# Patient Record
Sex: Female | Born: 1940
Health system: Southern US, Community
[De-identification: ages and names within clinical notes are randomized; demographics above are authoritative.]

## PROBLEM LIST (undated history)

## (undated) ENCOUNTER — Emergency Department (HOSPITAL_COMMUNITY): Payer: Medicare PPO | Source: Home / Self Care

## (undated) DIAGNOSIS — F319 Bipolar disorder, unspecified: Secondary | ICD-10-CM

## (undated) DIAGNOSIS — K219 Gastro-esophageal reflux disease without esophagitis: Secondary | ICD-10-CM

## (undated) DIAGNOSIS — M1990 Unspecified osteoarthritis, unspecified site: Secondary | ICD-10-CM

## (undated) DIAGNOSIS — E039 Hypothyroidism, unspecified: Secondary | ICD-10-CM

## (undated) DIAGNOSIS — F32A Depression, unspecified: Secondary | ICD-10-CM

## (undated) DIAGNOSIS — E079 Disorder of thyroid, unspecified: Secondary | ICD-10-CM

## (undated) DIAGNOSIS — E7800 Pure hypercholesterolemia, unspecified: Secondary | ICD-10-CM

## (undated) DIAGNOSIS — J449 Chronic obstructive pulmonary disease, unspecified: Secondary | ICD-10-CM

## (undated) DIAGNOSIS — N390 Urinary tract infection, site not specified: Secondary | ICD-10-CM

## (undated) HISTORY — PX: CHOLECYSTECTOMY: SHX55

## (undated) HISTORY — DX: Gastro-esophageal reflux disease without esophagitis: K21.9

## (undated) HISTORY — PX: EXTERNAL FIXATION WRIST FRACTURE: SHX1553

## (undated) HISTORY — DX: Depression, unspecified: F32.A

---

## 2012-11-27 ENCOUNTER — Encounter (HOSPITAL_COMMUNITY): Payer: Self-pay | Admitting: *Deleted

## 2012-11-27 ENCOUNTER — Emergency Department (HOSPITAL_COMMUNITY)
Admission: EM | Admit: 2012-11-27 | Discharge: 2012-11-30 | Disposition: A | Discharge status: Other Acute Inpatient Hospital | Payer: Medicare PPO | Attending: Emergency Medicine | Admitting: Emergency Medicine

## 2012-11-27 DIAGNOSIS — Z8639 Personal history of other endocrine, nutritional and metabolic disease: Secondary | ICD-10-CM | POA: Insufficient documentation

## 2012-11-27 DIAGNOSIS — N390 Urinary tract infection, site not specified: Secondary | ICD-10-CM | POA: Insufficient documentation

## 2012-11-27 DIAGNOSIS — R443 Hallucinations, unspecified: Secondary | ICD-10-CM | POA: Insufficient documentation

## 2012-11-27 DIAGNOSIS — F22 Delusional disorders: Secondary | ICD-10-CM | POA: Insufficient documentation

## 2012-11-27 DIAGNOSIS — A499 Bacterial infection, unspecified: Secondary | ICD-10-CM | POA: Insufficient documentation

## 2012-11-27 DIAGNOSIS — F17200 Nicotine dependence, unspecified, uncomplicated: Secondary | ICD-10-CM | POA: Insufficient documentation

## 2012-11-27 DIAGNOSIS — Z862 Personal history of diseases of the blood and blood-forming organs and certain disorders involving the immune mechanism: Secondary | ICD-10-CM | POA: Insufficient documentation

## 2012-11-27 DIAGNOSIS — B9689 Other specified bacterial agents as the cause of diseases classified elsewhere: Secondary | ICD-10-CM | POA: Insufficient documentation

## 2012-11-27 DIAGNOSIS — G4700 Insomnia, unspecified: Secondary | ICD-10-CM | POA: Insufficient documentation

## 2012-11-27 DIAGNOSIS — J4489 Other specified chronic obstructive pulmonary disease: Secondary | ICD-10-CM | POA: Insufficient documentation

## 2012-11-27 DIAGNOSIS — R4182 Altered mental status, unspecified: Secondary | ICD-10-CM | POA: Insufficient documentation

## 2012-11-27 DIAGNOSIS — F319 Bipolar disorder, unspecified: Secondary | ICD-10-CM | POA: Insufficient documentation

## 2012-11-27 DIAGNOSIS — J449 Chronic obstructive pulmonary disease, unspecified: Secondary | ICD-10-CM | POA: Insufficient documentation

## 2012-11-27 HISTORY — DX: Chronic obstructive pulmonary disease, unspecified: J44.9

## 2012-11-27 HISTORY — DX: Disorder of thyroid, unspecified: E07.9

## 2012-11-27 HISTORY — DX: Bipolar disorder, unspecified: F31.9

## 2012-11-27 HISTORY — DX: Pure hypercholesterolemia, unspecified: E78.00

## 2012-11-27 LAB — CBC WITH DIFFERENTIAL/PLATELET
Basophils Absolute: 0 10*3/uL (ref 0.0–0.1)
Basophils Relative: 0 % (ref 0–1)
Eosinophils Relative: 2 % (ref 0–5)
HCT: 41.3 % (ref 36.0–46.0)
Hemoglobin: 13.7 g/dL (ref 12.0–15.0)
MCH: 33.6 pg (ref 26.0–34.0)
MCHC: 33.2 g/dL (ref 30.0–36.0)
MCV: 101.2 fL — ABNORMAL HIGH (ref 78.0–100.0)
Monocytes Absolute: 0.4 10*3/uL (ref 0.1–1.0)
Monocytes Relative: 4 % (ref 3–12)
RDW: 12.8 % (ref 11.5–15.5)

## 2012-11-27 LAB — COMPREHENSIVE METABOLIC PANEL
AST: 15 U/L (ref 0–37)
Albumin: 3.7 g/dL (ref 3.5–5.2)
BUN: 11 mg/dL (ref 6–23)
Calcium: 10 mg/dL (ref 8.4–10.5)
Creatinine, Ser: 1.07 mg/dL (ref 0.50–1.10)
GFR calc non Af Amer: 50 mL/min — ABNORMAL LOW (ref >90)
Total Bilirubin: 0.3 mg/dL (ref 0.3–1.2)

## 2012-11-27 LAB — RAPID URINE DRUG SCREEN, HOSP PERFORMED
Barbiturates: NOT DETECTED
Benzodiazepines: NOT DETECTED
Cocaine: NOT DETECTED
Opiates: NOT DETECTED

## 2012-11-27 LAB — URINALYSIS, ROUTINE W REFLEX MICROSCOPIC
Glucose, UA: NEGATIVE mg/dL
Protein, ur: NEGATIVE mg/dL
pH: 7 (ref 5.0–8.0)

## 2012-11-27 LAB — URINE MICROSCOPIC-ADD ON

## 2012-11-27 MED ORDER — BECLOMETHASONE DIPROPIONATE 80 MCG/ACT IN AERS
2.0000 | INHALATION_SPRAY | Freq: Two times a day (BID) | RESPIRATORY_TRACT | Status: DC
  Filled 2012-11-27: qty 8.7

## 2012-11-27 MED ORDER — ATORVASTATIN CALCIUM 10 MG PO TABS
10.0000 mg | ORAL_TABLET | Freq: Every day | ORAL | Status: DC
  Administered 2012-11-27 – 2012-11-29 (×3): 10 mg via ORAL
  Filled 2012-11-27 (×5): qty 1

## 2012-11-27 MED ORDER — ALUM & MAG HYDROXIDE-SIMETH 200-200-20 MG/5ML PO SUSP: 30.0000 mL | ORAL | Status: DC | PRN

## 2012-11-27 MED ORDER — SODIUM CHLORIDE 0.9 % IV BOLUS (SEPSIS)
1000.0000 mL | Freq: Once | INTRAVENOUS | Status: AC
  Administered 2012-11-27: 1000 mL via INTRAVENOUS

## 2012-11-27 MED ORDER — ACETAMINOPHEN 325 MG PO TABS: 650.0000 mg | ORAL_TABLET | ORAL | Status: DC | PRN

## 2012-11-27 MED ORDER — NICOTINE 21 MG/24HR TD PT24
21.0000 mg | MEDICATED_PATCH | Freq: Every day | TRANSDERMAL | Status: DC
  Administered 2012-11-27 – 2012-11-29 (×2): 21 mg via TRANSDERMAL
  Filled 2012-11-27 (×2): qty 1

## 2012-11-27 MED ORDER — ALPRAZOLAM 0.5 MG PO TABS
1.0000 mg | ORAL_TABLET | Freq: Three times a day (TID) | ORAL | Status: DC | PRN
  Administered 2012-11-27: 1 mg via ORAL
  Filled 2012-11-27: qty 2

## 2012-11-27 MED ORDER — ONDANSETRON HCL 4 MG PO TABS: 4.0000 mg | ORAL_TABLET | Freq: Three times a day (TID) | ORAL | Status: DC | PRN

## 2012-11-27 MED ORDER — ZOLPIDEM TARTRATE 5 MG PO TABS: 5.0000 mg | ORAL_TABLET | Freq: Every evening | ORAL | Status: DC | PRN

## 2012-11-27 MED ORDER — LEVOTHYROXINE SODIUM 100 MCG PO TABS
100.0000 ug | ORAL_TABLET | Freq: Every day | ORAL | Status: DC
  Administered 2012-11-28 – 2012-11-29 (×2): 100 ug via ORAL
  Filled 2012-11-27 (×5): qty 1

## 2012-11-27 MED ORDER — LORATADINE 10 MG PO TABS
10.0000 mg | ORAL_TABLET | Freq: Every day | ORAL | Status: DC
  Administered 2012-11-27 – 2012-11-29 (×3): 10 mg via ORAL
  Filled 2012-11-27 (×3): qty 1

## 2012-11-27 NOTE — ED Notes (Signed)
Pt reports " make sure I take my lithium , I'm more afraid if I don't take it'. Pt does not understand why she is here " my daughter thinks I need to be seen". But is unsure why. Pt states" I live by myself, I'm not crazy, but I'll play along cuz I love my daughter".

## 2012-11-27 NOTE — ED Notes (Signed)
Called  [facility] for a consult.  Spoke with Jorja Loa, who took Pt info.  Says," he will call back with an appt time".  Nurse informed.
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
## 2012-11-27 NOTE — ED Notes (Signed)
BH called with 8pm appt. Time. Tele Cart placed in the room.

## 2012-11-27 NOTE — ED Notes (Signed)
Pt notified of request to stay overnight and get Lithium level down, rehydrate and re-evaluate in the morning.

## 2012-11-27 NOTE — ED Notes (Signed)
CRITICAL VALUE ALERT  Critical value received:  Lithium 1.80  Date of notification:  11/27/12  Time of notification:  1833  Critical value read back:yes  Nurse who received alert:  Tarri Glenn RN   MD notified (1st page):  Dr Rosalia Hammers  Time of first page:  1833  MD notified (2nd page):  Time of second page:  Responding MD:  Dr Rosalia Hammers  Time MD responded:  [id number]
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
## 2012-11-27 NOTE — ED Notes (Signed)
Patient ambulated to restroom with assist, tolerated well 

## 2012-11-27 NOTE — ED Notes (Signed)
Tele-psych conference occurring at this time. Pt answering questions, daughter and pt sitter at bedside.
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
## 2012-11-28 ENCOUNTER — Emergency Department (HOSPITAL_COMMUNITY): Payer: Medicare PPO

## 2012-11-28 MED ORDER — FLUTICASONE PROPIONATE HFA 44 MCG/ACT IN AERO
2.0000 | INHALATION_SPRAY | Freq: Two times a day (BID) | RESPIRATORY_TRACT | Status: DC
  Administered 2012-11-28 – 2012-11-29 (×4): 2 via RESPIRATORY_TRACT
  Filled 2012-11-28: qty 10.6

## 2012-11-28 MED ORDER — CEPHALEXIN 500 MG PO CAPS
500.0000 mg | ORAL_CAPSULE | Freq: Three times a day (TID) | ORAL | Status: DC
  Administered 2012-11-28 – 2012-11-30 (×5): 500 mg via ORAL
  Filled 2012-11-28 (×4): qty 1

## 2012-11-28 NOTE — ED Notes (Signed)
Pt gave verbal permission to speak with daughter Deloris Cresenciano Genre

## 2012-11-28 NOTE — ED Provider Notes (Signed)
  Lithium level has returned, UA is equivocal for UTI  Waiting to have telepsych by psychiatrist.    URINALYSIS, ROUTINE W REFLEX MICROSCOPIC      Result Value Range   Color, Urine YELLOW  YELLOW   APPearance CLEAR  CLEAR   Specific Gravity, Urine <1.005 (*) 1.005 - 1.030   pH 7.0  5.0 - 8.0   Glucose, UA NEGATIVE  NEGATIVE mg/dL   Hgb urine dipstick TRACE (*) NEGATIVE   Bilirubin Urine NEGATIVE  NEGATIVE   Ketones, ur NEGATIVE  NEGATIVE mg/dL   Protein, ur NEGATIVE  NEGATIVE mg/dL   Urobilinogen, UA 0.2  0.0 - 1.0 mg/dL   Nitrite NEGATIVE  NEGATIVE   Leukocytes, UA TRACE (*) NEGATIVE  URINE MICROSCOPIC-ADD ON      Result Value Range   WBC, UA 0-2  <3 WBC/hpf   Bacteria, UA RARE  RARE  LITHIUM LEVEL      Result Value Range   Lithium Lvl 1.37  0.80 - 1.40 mEq/L      Ward Givens, MD 11/28/12 1112

## 2012-11-28 NOTE — BH Assessment (Signed)
[facility] Assessment Progress Note  Update:  Received call from Dr. Lucianne Muss stating she completed the pt's tele assessment and inpatient stabilization recommended.  Called [facility] and no beds per [facility] @ 1419.  No beds at [facility].  Called [facility], and per [place] @ 1422, beds available.  Referral faxed for review.  Called [facility] and per [facility] @ [phone], they may have beds this afternoon and told writer to fax referral.  Referral faxed for review.  Called [facility] and no beds per [facility] @ 1441.  Clinician to continue to follow up on pt referral.

## 2012-11-28 NOTE — ED Notes (Signed)
CONTACT NUMBER FOR PT  [facility] [phone]

## 2012-11-28 NOTE — ED Notes (Signed)
Sitter remains at bedside,  

## 2012-11-28 NOTE — ED Provider Notes (Signed)
Dg Chest 2 View  11/28/2012   *RADIOLOGY REPORT*  Clinical Data: AMS, UTI, WEAKNESS, TREMOR  CHEST - 2 VIEW  Comparison: Report from 02/12/2008  Findings: The patient is rotated to the right on today's exam, resulting in reduced diagnostic sensitivity and specificity. Upper normal heart size.  Atherosclerotic calcification of the aortic arch noted.  The lungs appear clear.  Large lung volumes raise possibility of emphysema.  IMPRESSION:  1.  Large lung volumes, query emphysema. 2.  No acute findings. 3.  Aortic arch atherosclerosis.   Original Report Authenticated By: Gaylyn Rong, M.D.     Ward Givens, MD 11/28/12 [phone]

## 2012-11-28 NOTE — Progress Notes (Signed)
Gave pt inhaler flovent, pt did very poorly. She has some wheezes also may need neb treatment later.

## 2012-11-28 NOTE — ED Notes (Signed)
Dr Knapp at bedside,  

## 2012-11-28 NOTE — ED Notes (Signed)
Spoke with [facility] who advised that they are still seeking placement for pt, pt and family updated on plan of care and delay,
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
## 2012-11-28 NOTE — ED Notes (Signed)
PATIENT'S SON CALLED TO CHECK ON PATIENT, STATES THAT HE WOULD CALL BACK IN THE MORNING TO SEE IF ANY OTHER ARRANGEMENTS HAD BEEN MADE.

## 2012-11-28 NOTE — Progress Notes (Signed)
Teleassessment from yesterday reviewed with Dr. Lucianne Muss, MD. Dr. Lucianne Muss recommends geropsych inpatient hospitalization if symptoms continue after medically cleared. Rosey Bath, RN

## 2012-11-28 NOTE — ED Notes (Signed)
Psych consult completed,

## 2012-11-28 NOTE — ED Provider Notes (Signed)
Patient has history of bipolar disorder and was brought in yesterday by her daughter who felt patient was having visual hallucinations. Patient reports over the past week she's had worsening of a mild tremor that she's had in the past. She was noted to have a toxic lithium level of 1.8. Patient states her daughter doesn't understand her and she does not understand her daughter. She states she's feeling good this morning. She is noted to still have some tremor, her lithium level will be repeated this morning.  Patient was seen by [facility] yesterday and she has pending psychiatric evaluation once her lithium level is no longer toxic.  Devoria Albe, MD, [facility]   Ward Givens, MD 11/28/12 [phone]

## 2012-11-28 NOTE — ED Notes (Signed)
Daughter at bedside, update given to pt and daughter on plan of care, pt became upset with daughter after being told that she would need to be admitted to a psychiatric facility, states "I don't ever want to see you again, I hope I go somewhere and never come back" comfort measures provided to pt and daughter.
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
## 2012-11-29 MED ORDER — CEPHALEXIN 500 MG PO CAPS
ORAL_CAPSULE | ORAL | Status: AC
  Administered 2012-11-29: 500 mg via ORAL
  Filled 2012-11-29: qty 1

## 2012-11-29 NOTE — ED Notes (Signed)
Patient washed up and changed blue paper scrubs. Patient is anxious a this time. Sitting in chair looking out doorway.

## 2012-11-29 NOTE — ED Notes (Signed)
[facility] called states they are working on placement.

## 2012-11-29 NOTE — BH Assessment (Addendum)
[facility]HH Assessment Progress Note  This Clinical research associate received a call from [place] at [facility] at 18:49.  He reports that Dr Betti Cruz has agreed to accept pt to their wait list.  It is anticipated that a bed will be available tomorrow (12/01/2102).  He reports that one of their clinicians should call between 04:00 and 07:00 tomorrow, but that [name] or [facility] staff should call [phone] if we do not hear from [facility] in that time frame.  Christiane Ha inquired as to whether pt is currently under IVC.  I agreed to call him back with this information.  Doylene Canning, MA Triage Specialist 11/29/2012 @ 18:56  At 19:04 I received a call from the pt's nurse, Amber.  She reports that pt is under IVC.  At 19:06 I called back to [place].  I agreed to have [name] fax commitment papers to him at [phone].  At 19:09 I called back to [facility] and spoke to the pt's oncoming nurse, Marchelle Folks, providing the fax number.  She agreed to fax the commitment papers to [facility].  Doylene Canning, MA Triage Specialist 11/29/2012 @ 19:10

## 2012-11-29 NOTE — ED Notes (Signed)
Spoke with Kristen Sanchez from [facility] states that pt is placed on waiting list at [facility] and needs [place] to fax commitment papers to them

## 2012-11-29 NOTE — ED Notes (Signed)
Breakfast tray given. °

## 2012-11-29 NOTE — BH Assessment (Signed)
Courtney from [facility] called and said Pt has been declined due to not meeting criteria for their program.  Harlin Rain Ria Comment, [facility] Triage Specialist

## 2012-11-29 NOTE — ED Notes (Signed)
Pt sitting in chair. nad noted. Appropriate at this time. Denies any si or hi.

## 2012-11-29 NOTE — ED Notes (Addendum)
Commitment papers faxed to [facility] per [facility] request with [facility] [phone] to [employer].

## 2012-11-29 NOTE — ED Notes (Signed)
Pt's eyes closed. resp even/nonlabored. Even/rise fall of chest. Sitter at bsd. nad noted

## 2012-11-30 LAB — LITHIUM LEVEL: Lithium Lvl: 0.66 mEq/L — ABNORMAL LOW (ref 0.80–1.40)

## 2012-11-30 MED ORDER — CEPHALEXIN 500 MG PO CAPS
ORAL_CAPSULE | ORAL | Status: AC
  Filled 2012-11-30: qty 1

## 2012-11-30 NOTE — ED Notes (Signed)
[facility] called and requested IVC paperwork be faxed to them for review and then will try to get pt placed at [facility].

## 2012-11-30 NOTE — ED Notes (Signed)
[facility] at Bedside to transport pt.

## 2012-11-30 NOTE — ED Provider Notes (Signed)
[facility] is requesting repeat lithium level and this has been ordered.  Dione Booze, MD 11/30/12 [phone]
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
## 2012-11-30 NOTE — ED Notes (Signed)
Patient sitting in chair in room.  Sitter remains with patient.

## 2012-11-30 NOTE — BHH Counselor (Signed)
Per Juliann Pulse), pt accepted by Dr. Betti Cruz.  Pt has been accepted to [facility]. Report will need to be called to [phone].  Pt can be transported after 8am.

## 2012-11-30 NOTE — ED Notes (Signed)
Patient ambulated to bathroom; gait steady.  Patient returned to room and sitting up on side of bed.  Sitter remains with patient.

## 2013-01-22 ENCOUNTER — Encounter (HOSPITAL_COMMUNITY): Payer: Self-pay | Admitting: *Deleted

## 2013-01-22 ENCOUNTER — Emergency Department (HOSPITAL_COMMUNITY)
Admission: EM | Admit: 2013-01-22 | Discharge: 2013-01-24 | Disposition: A | Discharge status: Other Acute Inpatient Hospital | Payer: Medicare PPO | Attending: Emergency Medicine | Admitting: Emergency Medicine

## 2013-01-22 DIAGNOSIS — J4489 Other specified chronic obstructive pulmonary disease: Secondary | ICD-10-CM | POA: Insufficient documentation

## 2013-01-22 DIAGNOSIS — E079 Disorder of thyroid, unspecified: Secondary | ICD-10-CM | POA: Insufficient documentation

## 2013-01-22 DIAGNOSIS — F17200 Nicotine dependence, unspecified, uncomplicated: Secondary | ICD-10-CM | POA: Insufficient documentation

## 2013-01-22 DIAGNOSIS — IMO0002 Reserved for concepts with insufficient information to code with codable children: Secondary | ICD-10-CM | POA: Insufficient documentation

## 2013-01-22 DIAGNOSIS — F319 Bipolar disorder, unspecified: Secondary | ICD-10-CM | POA: Insufficient documentation

## 2013-01-22 DIAGNOSIS — J449 Chronic obstructive pulmonary disease, unspecified: Secondary | ICD-10-CM | POA: Insufficient documentation

## 2013-01-22 DIAGNOSIS — R441 Visual hallucinations: Secondary | ICD-10-CM

## 2013-01-22 DIAGNOSIS — Z87440 Personal history of urinary (tract) infections: Secondary | ICD-10-CM | POA: Insufficient documentation

## 2013-01-22 DIAGNOSIS — Z79899 Other long term (current) drug therapy: Secondary | ICD-10-CM | POA: Insufficient documentation

## 2013-01-22 DIAGNOSIS — Z792 Long term (current) use of antibiotics: Secondary | ICD-10-CM | POA: Insufficient documentation

## 2013-01-22 DIAGNOSIS — H5316 Psychophysical visual disturbances: Secondary | ICD-10-CM | POA: Insufficient documentation

## 2013-01-22 DIAGNOSIS — E876 Hypokalemia: Secondary | ICD-10-CM | POA: Insufficient documentation

## 2013-01-22 DIAGNOSIS — E7800 Pure hypercholesterolemia, unspecified: Secondary | ICD-10-CM | POA: Insufficient documentation

## 2013-01-22 DIAGNOSIS — R4182 Altered mental status, unspecified: Secondary | ICD-10-CM | POA: Insufficient documentation

## 2013-01-22 HISTORY — DX: Urinary tract infection, site not specified: N39.0

## 2013-01-22 LAB — CBC WITH DIFFERENTIAL/PLATELET
Basophils Absolute: 0 10*3/uL (ref 0.0–0.1)
HCT: 34.3 % — ABNORMAL LOW (ref 36.0–46.0)
Lymphocytes Relative: 20 % (ref 12–46)
Lymphs Abs: 1.4 10*3/uL (ref 0.7–4.0)
MCV: 98.3 fL (ref 78.0–100.0)
Monocytes Absolute: 0.5 10*3/uL (ref 0.1–1.0)
Neutro Abs: 5.1 10*3/uL (ref 1.7–7.7)
Platelets: 295 10*3/uL (ref 150–400)
RBC: 3.49 MIL/uL — ABNORMAL LOW (ref 3.87–5.11)
RDW: 13.4 % (ref 11.5–15.5)
WBC: 7.1 10*3/uL (ref 4.0–10.5)

## 2013-01-22 LAB — URINE MICROSCOPIC-ADD ON

## 2013-01-22 LAB — BASIC METABOLIC PANEL
CO2: 30 mEq/L (ref 19–32)
Chloride: 100 mEq/L (ref 96–112)
Glucose, Bld: 101 mg/dL — ABNORMAL HIGH (ref 70–99)
Sodium: 139 mEq/L (ref 135–145)

## 2013-01-22 LAB — URINALYSIS, ROUTINE W REFLEX MICROSCOPIC
Leukocytes, UA: NEGATIVE
Nitrite: NEGATIVE
Specific Gravity, Urine: <1.005 — ABNORMAL LOW (ref 1.005–1.030)
Urobilinogen, UA: 0.2 mg/dL (ref 0.0–1.0)
pH: 6 (ref 5.0–8.0)

## 2013-01-22 LAB — LITHIUM LEVEL: Lithium Lvl: <0.25 mEq/L — ABNORMAL LOW (ref 0.80–1.40)

## 2013-01-22 MED ORDER — LAMOTRIGINE 25 MG PO TABS: 25.0000 mg | ORAL_TABLET | Freq: Every day | ORAL | Status: DC

## 2013-01-22 MED ORDER — LAMOTRIGINE 25 MG PO TABS
25.0000 mg | ORAL_TABLET | Freq: Every day | ORAL | Status: DC
  Administered 2013-01-23 – 2013-01-24 (×2): 25 mg via ORAL
  Filled 2013-01-22 (×4): qty 1

## 2013-01-22 MED ORDER — LURASIDONE HCL 40 MG PO TABS
20.0000 mg | ORAL_TABLET | Freq: Every day | ORAL | Status: DC
  Administered 2013-01-23 – 2013-01-24 (×2): 20 mg via ORAL
  Filled 2013-01-22 (×4): qty 1

## 2013-01-22 MED ORDER — ATORVASTATIN CALCIUM 10 MG PO TABS
10.0000 mg | ORAL_TABLET | Freq: Every day | ORAL | Status: DC
  Administered 2013-01-23: 10 mg via ORAL
  Filled 2013-01-22 (×4): qty 1

## 2013-01-22 MED ORDER — LEVOTHYROXINE SODIUM 100 MCG PO TABS
100.0000 ug | ORAL_TABLET | Freq: Every day | ORAL | Status: DC
  Administered 2013-01-23 – 2013-01-24 (×2): 100 ug via ORAL
  Filled 2013-01-22 (×5): qty 1

## 2013-01-22 MED ORDER — ALPRAZOLAM 0.5 MG PO TABS
0.5000 mg | ORAL_TABLET | Freq: Every evening | ORAL | Status: DC | PRN
  Administered 2013-01-23 (×2): 0.5 mg via ORAL
  Filled 2013-01-22 (×2): qty 1

## 2013-01-22 MED ORDER — POTASSIUM CHLORIDE CRYS ER 20 MEQ PO TBCR
40.0000 meq | EXTENDED_RELEASE_TABLET | Freq: Three times a day (TID) | ORAL | Status: AC
  Administered 2013-01-22 – 2013-01-23 (×3): 40 meq via ORAL
  Filled 2013-01-22 (×3): qty 2

## 2013-01-22 MED ORDER — POTASSIUM CHLORIDE CRYS ER 20 MEQ PO TBCR
40.0000 meq | EXTENDED_RELEASE_TABLET | Freq: Once | ORAL | Status: AC
  Administered 2013-01-22: 40 meq via ORAL
  Filled 2013-01-22: qty 2

## 2013-01-22 MED ORDER — FLUTICASONE PROPIONATE HFA 44 MCG/ACT IN AERO
2.0000 | INHALATION_SPRAY | Freq: Two times a day (BID) | RESPIRATORY_TRACT | Status: DC
  Administered 2013-01-22 – 2013-01-24 (×4): 2 via RESPIRATORY_TRACT
  Filled 2013-01-22 (×2): qty 10.6

## 2013-01-22 MED ORDER — FLUTICASONE PROPIONATE HFA 44 MCG/ACT IN AERO
INHALATION_SPRAY | RESPIRATORY_TRACT | Status: AC
  Filled 2013-01-22: qty 10.6

## 2013-01-22 MED ORDER — FUROSEMIDE 40 MG PO TABS
20.0000 mg | ORAL_TABLET | ORAL | Status: DC
  Administered 2013-01-24: 20 mg via ORAL
  Filled 2013-01-22: qty 1

## 2013-01-22 NOTE — ED Notes (Signed)
Pt was served with IVC papers per [employer].
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
## 2013-01-22 NOTE — ED Provider Notes (Signed)
Awaiting geriatric psych placement  Donnetta Hutching, MD 01/22/13 2237

## 2013-01-22 NOTE — ED Notes (Signed)
Pt was served with IVC papers by [employer] at this time . IVC paperwork is good for 7 days from time served. Kristen Sanchez

## 2013-01-22 NOTE — ED Notes (Signed)
Tele-psych with Ala Dach at [facility] completed.  Counselor spoke with Dr Adriana Simas Per hisAla Dach) request

## 2013-01-22 NOTE — ED Notes (Addendum)
Has had progressive confusion since 11/27/12.  Was admitted to [facility], then went to [place], back to [facility].  2 weeks ago she began getting confused again, having visual and auditory hallucinations according to granddaughter.  Continuous talking, flight of ideas. Granddaughter states patient was seen at [facility] yesterday for fall at home.  Patient denies this.  Bilateral ankle edema.

## 2013-01-22 NOTE — ED Provider Notes (Addendum)
CSN: [id number]     Arrival date & time 01/22/13  1852 History   First MD Initiated Contact with Patient 01/22/13 1909     Chief Complaint  Patient presents with  . Altered Mental Status  . Hallucinations   (Consider location/radiation/quality/duration/timing/severity/associated sxs/prior Treatment) Patient is a 72 y.o. female presenting with altered mental status. The history is provided by a relative (the grandaughter states the pt is having hallucinations.  the pt is speaking to people who are not there).  Altered Mental Status Presenting symptoms: behavior changes   Severity:  Severe Most recent episode:  More than 2 days ago Episode history:  Multiple Timing:  Constant Progression:  Worsening Chronicity:  Recurrent Context: not alcohol use   Associated symptoms: hallucinations   Associated symptoms: no abdominal pain, no headaches, no rash and no seizures     Past Medical History  Diagnosis Date  . Bipolar disorder   . Thyroid disease   . High cholesterol   . COPD (chronic obstructive pulmonary disease)   . UTI (lower urinary tract infection)    History reviewed. No pertinent past surgical history. History reviewed. No pertinent family history. History  Substance Use Topics  . Smoking status: Current Every Day Smoker -- 1.00 packs/day    Types: Cigarettes  . Smokeless tobacco: Not on file  . Alcohol Use: No   OB History   Grav Para Term Preterm Abortions TAB SAB Ect Mult Living                 Review of Systems  Constitutional: Negative for appetite change and fatigue.  HENT: Negative for congestion, sinus pressure and ear discharge.   Eyes: Negative for discharge.  Respiratory: Negative for cough.   Cardiovascular: Negative for chest pain.  Gastrointestinal: Negative for abdominal pain and diarrhea.  Genitourinary: Negative for frequency and hematuria.  Musculoskeletal: Negative for back pain.  Skin: Negative for rash.  Neurological: Negative for seizures  and headaches.  Psychiatric/Behavioral: Positive for hallucinations.    Allergies  Review of patient's allergies indicates no known allergies.  Home Medications   Current Outpatient Rx  Name  Route  Sig  Dispense  Refill  . ALPRAZolam (XANAX) 0.5 MG tablet   Oral   Take 0.5 mg by mouth at bedtime as needed for sleep or anxiety.         . beclomethasone (QVAR) 80 MCG/ACT inhaler   Inhalation   Inhale 1 puff into the lungs 2 (two) times daily.         Marland Kitchen lamoTRIgine (LAMICTAL) 25 MG tablet   Oral   Take 25 mg by mouth daily. 3 day course starting on 01/17/2013         . levofloxacin (LEVAQUIN) 750 MG tablet   Oral   Take 750 mg by mouth daily.          . Lurasidone HCl (LATUDA) 20 MG TABS   Oral   Take 20 mg by mouth daily.         Marland Kitchen atorvastatin (LIPITOR) 10 MG tablet   Oral   Take 10 mg by mouth at bedtime.         . furosemide (LASIX) 20 MG tablet   Oral   Take 20 mg by mouth 2 (two) times a week.         . levothyroxine (SYNTHROID, LEVOTHROID) 100 MCG tablet   Oral   Take 100 mcg by mouth every morning.         Marland Kitchen  loratadine (CLARITIN) 10 MG tablet   Oral   Take 10 mg by mouth every morning.          BP 107/52  Pulse 88  Temp(Src) 97.3 F (36.3 C) (Oral)  Resp 18  SpO2 95% Physical Exam  Constitutional: She is oriented to person, place, and time. She appears well-developed.  HENT:  Head: Normocephalic.  Eyes: Conjunctivae and EOM are normal. No scleral icterus.  Neck: Neck supple. No thyromegaly present.  Cardiovascular: Normal rate and regular rhythm.  Exam reveals no gallop and no friction rub.   No murmur heard. Pulmonary/Chest: No stridor. She has no wheezes. She has no rales. She exhibits no tenderness.  Abdominal: She exhibits no distension. There is no tenderness. There is no rebound.  Musculoskeletal: Normal range of motion. She exhibits no edema.  Lymphadenopathy:    She has no cervical adenopathy.  Neurological: She is  oriented to person, place, and time. She exhibits normal muscle tone. Coordination normal.  Skin: No rash noted. No erythema.  Psychiatric:  The pt is having visual hallucinations    ED Course  Procedures (including critical care time) Labs Review Labs Reviewed  CBC WITH DIFFERENTIAL - Abnormal; Notable for the following:    RBC 3.49 (*)    Hemoglobin 11.4 (*)    HCT 34.3 (*)    All other components within normal limits  BASIC METABOLIC PANEL - Abnormal; Notable for the following:    Potassium 2.8 (*)    Glucose, Bld 101 (*)    Creatinine, Ser 1.11 (*)    GFR calc non Af Amer 48 (*)    GFR calc Af Amer 55 (*)    All other components within normal limits  LITHIUM LEVEL - Abnormal; Notable for the following:    Lithium Lvl <0.25 (*)    All other components within normal limits  URINALYSIS, ROUTINE W REFLEX MICROSCOPIC - Abnormal; Notable for the following:    Specific Gravity, Urine <1.005 (*)    Hgb urine dipstick TRACE (*)    All other components within normal limits  URINE MICROSCOPIC-ADD ON - Abnormal; Notable for the following:    Squamous Epithelial / LPF FEW (*)    All other components within normal limits  DRUG SCREEN PANEL (SERUM)   Imaging Review No results found.  MDM  No diagnosis found.    The chart was scribed for me under my direct supervision.  I personally performed the history, physical, and medical decision making and all procedures in the evaluation of this patient.Benny Lennert, MD 01/23/13 2138  Benny Lennert, MD 01/23/13 2142

## 2013-01-22 NOTE — ED Notes (Signed)
Sitter at bedside. AC and charge RN aware of pt status.

## 2013-01-22 NOTE — ED Notes (Signed)
Ford at [facility] called to inform consult to be done at 2215.

## 2013-01-22 NOTE — ED Notes (Signed)
Pt brought to ED by Granddaughter secondary to worsening auditory and visual hallucinations.  EDP notified as well as security.  No sitter at bedside thus far secondary to pt's denial of SI/HI.

## 2013-01-23 ENCOUNTER — Emergency Department (HOSPITAL_COMMUNITY): Payer: Medicare PPO

## 2013-01-23 NOTE — Progress Notes (Signed)
Underwriter spoke with Kristen Sanchez in ED requesting a potassium and chest x-ray be completed for pt for referral packets.  Pt has been denied due to acuity for [facility].  [facility] and [facility] are still reviewing referral send on 01/22/13.  Faxed new referrals to [facility] and [facility] who will have bed availability on 01/24/13.

## 2013-01-23 NOTE — ED Notes (Signed)
Pt's granddaughter to be contacted with updates per pt. Name Herbert Seta, [phone], or at home [phone].

## 2013-01-23 NOTE — BH Assessment (Addendum)
[facility] Assessment Progress Note Update:  Received call from [facility] at [facility].  Dr. Mayford Knife needs pt's current potassium level and a copy of pt's IVC before he will consider pt.  He also asked that pt's current psychiatrist be contacted, as he recently changed pt's medications.  Informed Herbert Seta [facility] could not do this, as we have no consent to release information and because it is a HIPPA violation.  TTS needs to fax requested materials to [facility].

## 2013-01-23 NOTE — ED Notes (Signed)
Patient states that there is nothing wrong with her. That it is okay if she talks to herself because there is no one else around. Patient states that some family members are jealous and that is their problem not hers. Patient states that she wants to go home, there is no reason for her to be here. States that someone needs to come and pick her up.

## 2013-01-23 NOTE — ED Notes (Signed)
Patient currently eating breakfast. Alert. Patient with pressured speech, rapid. Sitter at bedside.

## 2013-01-23 NOTE — Progress Notes (Signed)
Spoke with Annice Pih, from  on 01/23/13 at 630. Pt was denied for inability to complete ADLs independently.

## 2013-01-23 NOTE — ED Notes (Signed)
Patient is resting comfortably. 

## 2013-01-23 NOTE — ED Notes (Signed)
Pt awaiting geriatric bed. No updates given recently. Pt calm and cooperative.

## 2013-01-23 NOTE — BH Assessment (Signed)
[facility] Assessment Progress Note  Update:  Called [facility] in reference to pt referral and pt declined due to med acuity by Dr. Betti Cruz per [facility] @ 1145.  Called [facility] and left message @ 1147.  Called [facility] and per [facility] @ 1147, pt declined by Dr. Sara Chu due to chronicity @ 1147.  Called [facility] and no beds per [employer] @ 1149.  Called [facility] and per [facility] @ 1150, no beds.  Called [facility] and beds available per [facility] @ 1150.  Referral faxed for review.  TTS to follow up with referral.

## 2013-01-23 NOTE — ED Notes (Signed)
Pt ate 50% of food tray

## 2013-01-23 NOTE — Progress Notes (Signed)
Underwriter initiated inpatient bed placement for pt.  The following hospitals were contacted on pts behalf for inpatient psychiatric treatment: 1)[facility]-no beds until 01/24/13 2)[facility]-faxed referral 3)[facility]- no beds 4)[facility]- no beds 5)[facility]- no beds 6)[facility]- no beds 7)[facility]-no beds 8)[facility]- faxed referral 9) [facility]- faxed referral 10)[facility]- fax referral 11) [facility]- at capacity

## 2013-01-24 ENCOUNTER — Other Ambulatory Visit: Payer: Self-pay

## 2013-01-24 NOTE — ED Notes (Signed)
Faxed paperwork to [facility] [phone])   Spoke with daughter (Kristen Sanchez) wants [place] to call her when placement is decided [phone] or [phone]

## 2013-01-24 NOTE — Progress Notes (Signed)
Alleen Borne called from [facility] requesting an IVC and QPE be faxed to present to MD this morning.  Will faxed IVC and QPE once received from [place], RN at [facility].  Blain Pais, MHT/NS

## 2013-01-24 NOTE — Progress Notes (Addendum)
Continued bed finding for pt.  [facility] unsure of status whether denied or pending, asked to callback in the afternoon.  [facility] received potassium labs faxed.  The following hospitals were faxed referral for open geriatric beds: 1)[facility] 2)[facility] 3)[facility] 4)[facility]  Blain Pais, MHT/NS

## 2013-01-24 NOTE — ED Notes (Signed)
Per Ala Dach at [facility]: [facility] may take her in the morning.

## 2013-01-24 NOTE — BH Assessment (Signed)
Received call from [facility]. They will have a bed available later this morning and they will accept Pt with the following conditions:  - Potassium is 2.8 and "needs to be addressed." - They need a faxed copy of IVC paperwork - They would like a chest x-ray if possible - They would like an EKG if possible.  Notified Darl Pikes, RN at [facility]. Please call [facility] at [phone].  Harlin Rain Ria Comment, [facility] Triage Specialist

## 2013-01-24 NOTE — ED Provider Notes (Signed)
8:23 AM: Pt states "I feel good and I want to go home."  She states "I could die from being in the hospital."  She notes that she lives alone and has been taking her medication as prescribed.  She states she does not know why she was brought to the hospital and "it's another damn plot to get momma in the crazy house."  She states that she thinks her daughters want her to be in the hospital. Pt sitting in chair beside stretcher and has eaten her breakfast.   Inpatient psychiatry has requested CXR and EKG, her potassium has been corrected and was 4.1 this am  Dg Chest Portable 1 View  01/24/2013   *RADIOLOGY REPORT*  Clinical Data: Altered mental status, hallucinations  PORTABLE CHEST - 1 VIEW  Comparison: Prior chest x-ray 01/13/2013  Findings: The patient is rotated toward the right.  Interval clearing of the right middle lobe compared to the prior chest x- ray.  Lung volumes are low.  Chronic central bronchitic changes and prominence of the interstitial markings appears similar compared to prior.  Stable cardiac and mediastinal contours.  Atherosclerotic calcifications noted in the transverse aorta.  No acute osseous abnormality.  IMPRESSION:  1.  No acute cardiopulmonary process. 2.  Interval clearing of right middle lobe infiltrate compared to prior. 3.  Stable chronic lung changes.   Original Report Authenticated By: Malachy Moan, M.D.    Date: 01/24/2013  Rate: 72  Rhythm: normal sinus rhythm  QRS Axis: normal  Intervals: normal  ST/T Wave abnormalities: normal  Conduction Disutrbances:right bundle branch block  Narrative Interpretation:   Old EKG Reviewed: unchanged from EKG 11/27/2012  Devoria Albe, MD, FACEP   I personally performed the services described in this documentation, which was scribed in my presence. The recorded information has been reviewed and considered.   Devoria Albe, MD, Armando Gang     Ward Givens, MD 01/24/13 [phone]

## 2013-01-24 NOTE — ED Provider Notes (Signed)
Still awaiting geriatric behavioral health placement. Will need to check back with [facility] to see status.   Shelda Jakes, MD 01/24/13 [phone]

## 2013-01-24 NOTE — ED Notes (Signed)
Per [facility] intake coordinator, requested a copy of EKG, and IVC paperwork faxed to them.

## 2013-01-24 NOTE — Progress Notes (Signed)
[facility] hospital callback stating have bed available for geriatric.  Faxed referral on pts behalf.  Kristen Sanchez

## 2013-01-24 NOTE — ED Provider Notes (Signed)
PT has been accepted at [facility] by Dr Hope Budds, MD 01/24/13 [phone]

## 2013-01-24 NOTE — Progress Notes (Signed)
Received call informing us that patient has been accepted at [facility]. Request made to patient's nurse to fax involuntary paperwork to [facility].  Carney Bern, LCSW

## 2013-01-26 LAB — DRUG SCREEN PANEL (SERUM)

## 2013-09-16 ENCOUNTER — Encounter (HOSPITAL_COMMUNITY): Payer: Self-pay | Admitting: Pharmacy Technician

## 2013-09-21 ENCOUNTER — Encounter (HOSPITAL_COMMUNITY)
Admission: RE | Admit: 2013-09-21 | Discharge: 2013-09-21 | Disposition: A | Discharge status: Home / Self Care | Payer: Medicare PPO | Source: Ambulatory Visit | Attending: Ophthalmology | Admitting: Ophthalmology

## 2013-09-21 NOTE — Patient Instructions (Addendum)
Kristen Sanchez  09/21/2013   Your procedure is scheduled on:  09/26/13  Report to [name] at 7:30 AM.  Call this number if you have problems the morning of surgery: [phone]   Remember:   Do not eat food or drink liquids after midnight.   Take these medicines the morning of surgery with A SIP OF WATER: Xanax, Qvar, Lamictal, Synthroid, Paxil   Do not wear jewelry, make-up or nail polish.  Do not wear lotions, powders, or perfumes. You may wear deodorant.  Do not shave 48 hours prior to surgery. Men may shave face and neck.  Do not bring valuables to the hospital.  [facility] is not responsible for any belongings or valuables.               Contacts, dentures or bridgework may not be worn into surgery.  Leave suitcase in the car. After surgery it may be brought to your room.  For patients admitted to the hospital, discharge time is determined by your treatment team.               Patients discharged the day of surgery will not be allowed to drive home.  Name and phone number of your driver:   Special Instructions: N/A   Please read over the following fact sheets that you were given: Anesthesia Post-op Instructions   PATIENT INSTRUCTIONS POST-ANESTHESIA  IMMEDIATELY FOLLOWING SURGERY:  Do not drive or operate machinery for the first twenty four hours after surgery.  Do not make any important decisions for twenty four hours after surgery or while taking narcotic pain medications or sedatives.  If you develop intractable nausea and vomiting or a severe headache please notify your doctor immediately.  FOLLOW-UP:  Please make an appointment with your surgeon as instructed. You do not need to follow up with anesthesia unless specifically instructed to do so.  WOUND CARE INSTRUCTIONS (if applicable):  Keep a dry clean dressing on the anesthesia/puncture wound site if there is drainage.  Once the wound has quit draining you may leave it open to air.  Generally you should leave the  bandage intact for twenty four hours unless there is drainage.  If the epidural site drains for more than 36-48 hours please call the anesthesia department.  QUESTIONS?:  Please feel free to call your physician or the hospital operator if you have any questions, and they will be happy to assist you.      Cataract Surgery  A cataract is a clouding of the lens of the eye. When a lens becomes cloudy, vision is reduced based on the degree and nature of the clouding. Surgery may be needed to improve vision. Surgery removes the cloudy lens and usually replaces it with a substitute lens (intraocular lens, IOL). LET YOUR EYE DOCTOR KNOW ABOUT:  Allergies to food or medicine.  Medicines taken including herbs, eyedrops, over-the-counter medicines, and creams.  Use of steroids (by mouth or creams).  Previous problems with anesthetics or numbing medicine.  History of bleeding problems or blood clots.  Previous surgery.  Other health problems, including diabetes and kidney problems.  Possibility of pregnancy, if this applies. RISKS AND COMPLICATIONS  Infection.  Inflammation of the eyeball (endophthalmitis) that can spread to both eyes (sympathetic ophthalmia).  Poor wound healing.  If an IOL is inserted, it can later fall out of proper position. This is very uncommon.  Clouding of the part of your eye that holds an IOL in place. This is called an "  after-cataract." These are uncommon, but easily treated. BEFORE THE PROCEDURE  Do not eat or drink anything except small amounts of water for 8 to 12 before your surgery, or as directed by your caregiver.  Unless you are told otherwise, continue any eyedrops you have been prescribed.  Talk to your primary caregiver about all other medicines that you take (both prescription and non-prescription). In some cases, you may need to stop or change medicines near the time of your surgery. This is most important if you are taking blood-thinning  medicine.Do not stop medicines unless you are told to do so.  Arrange for someone to drive you to and from the procedure.  Do not put contact lenses in either eye on the day of your surgery. PROCEDURE There is more than one method for safely removing a cataract. Your doctor can explain the differences and help determine which is best for you. Phacoemulsification surgery is the most common form of cataract surgery.  An injection is given behind the eye or eyedrops are given to make this a painless procedure.  A small cut (incision) is made on the edge of the clear, dome-shaped surface that covers the front of the eye (cornea).  A tiny probe is painlessly inserted into the eye. This device gives off ultrasound waves that soften and break up the cloudy center of the lens. This makes it easier for the cloudy lens to be removed by suction.  An IOL may be implanted.  The normal lens of the eye is covered by a clear capsule. Part of that capsule is intentionally left in the eye to support the IOL.  Your surgeon may or may not use stitches to close the incision. There are other forms of cataract surgery that require a larger incision and stiches to close the eye. This approach is taken in cases where the doctor feels that the cataract cannot be easily removed using phacoemulsification. AFTER THE PROCEDURE  When an IOL is implanted, it does not need care. It becomes a permanent part of your eye and cannot be seen or felt.  Your doctor will schedule follow-up exams to check on your progress.  Review your other medicines with your doctor to see which can be resumed after surgery.  Use eyedrops or take medicine as prescribed by your doctor. Document Released: 03/27/2011 Document Revised: 06/30/2011 Document Reviewed: 03/27/2011 [facility] Patient Information 2014 [place], [place].

## 2013-09-22 ENCOUNTER — Encounter (HOSPITAL_COMMUNITY): Admission: RE | Admit: 2013-09-22 | Payer: Medicare PPO | Source: Ambulatory Visit

## 2013-09-29 NOTE — Patient Instructions (Signed)
Your procedure is scheduled on:  10/10/2013  Report to [facility] at  9:30    AM.  Call this number if you have problems the morning of surgery: [phone]   Remember:   Do not eat or drink :After Midnight.    Take these medicines the morning of surgery with A SIP OF WATER: Xanax, Qvar, Lamictal, Synthroid, Paxil    Do not wear jewelry, make-up or nail polish.  Do not wear lotions, powders, or perfumes. You may wear deodorant.  Do not shave 48 hours prior to surgery.  Do not bring valuables to the hospital.  Contacts, dentures or bridgework may not be worn into surgery.  Patients discharged the day of surgery will not be allowed to drive home.  Name and phone number of your driver:    Please read over the following fact sheets that you were given: Pain Booklet, Surgical Site Infection Prevention, Anesthesia Post-op Instructions and Care and Recovery After Surgery  Cataract Surgery  A cataract is a clouding of the lens of the eye. When a lens becomes cloudy, vision is reduced based on the degree and nature of the clouding. Surgery may be needed to improve vision. Surgery removes the cloudy lens and usually replaces it with a substitute lens (intraocular lens, IOL). LET YOUR EYE DOCTOR KNOW ABOUT:  Allergies to food or medicine.   Medicines taken including herbs, eyedrops, over-the-counter medicines, and creams.   Use of steroids (by mouth or creams).   Previous problems with anesthetics or numbing medicine.   History of bleeding problems or blood clots.   Previous surgery.   Other health problems, including diabetes and kidney problems.   Possibility of pregnancy, if this applies.  RISKS AND COMPLICATIONS  Infection.   Inflammation of the eyeball (endophthalmitis) that can spread to both eyes (sympathetic ophthalmia).   Poor wound healing.   If an IOL is inserted, it can later fall out of proper position. This is very uncommon.   Clouding of the part of your eye that  holds an IOL in place. This is called an "after-cataract." These are uncommon, but easily treated.  BEFORE THE PROCEDURE  Do not eat or drink anything except small amounts of water for 8 to 12 before your surgery, or as directed by your caregiver.   Unless you are told otherwise, continue any eyedrops you have been prescribed.   Talk to your primary caregiver about all other medicines that you take (both prescription and non-prescription). In some cases, you may need to stop or change medicines near the time of your surgery. This is most important if you are taking blood-thinning medicine.Do not stop medicines unless you are told to do so.   Arrange for someone to drive you to and from the procedure.   Do not put contact lenses in either eye on the day of your surgery.  PROCEDURE There is more than one method for safely removing a cataract. Your doctor can explain the differences and help determine which is best for you. Phacoemulsification surgery is the most common form of cataract surgery.  An injection is given behind the eye or eyedrops are given to make this a painless procedure.   A small cut (incision) is made on the edge of the clear, dome-shaped surface that covers the front of the eye (cornea).   A tiny probe is painlessly inserted into the eye. This device gives off ultrasound waves that soften and break up the cloudy center of the lens.  This makes it easier for the cloudy lens to be removed by suction.   An IOL may be implanted.   The normal lens of the eye is covered by a clear capsule. Part of that capsule is intentionally left in the eye to support the IOL.   Your surgeon may or may not use stitches to close the incision.  There are other forms of cataract surgery that require a larger incision and stiches to close the eye. This approach is taken in cases where the doctor feels that the cataract cannot be easily removed using phacoemulsification. AFTER THE  PROCEDURE  When an IOL is implanted, it does not need care. It becomes a permanent part of your eye and cannot be seen or felt.   Your doctor will schedule follow-up exams to check on your progress.   Review your other medicines with your doctor to see which can be resumed after surgery.   Use eyedrops or take medicine as prescribed by your doctor.  Document Released: 03/27/2011 Document Reviewed: 03/24/2011 [facility] Patient Information 2012 [place].  .Cataract Surgery Care After Refer to this sheet in the next few weeks. These instructions provide you with information on caring for yourself after your procedure. Your caregiver may also give you more specific instructions. Your treatment has been planned according to current medical practices, but problems sometimes occur. Call your caregiver if you have any problems or questions after your procedure.  HOME CARE INSTRUCTIONS   Avoid strenuous activities as directed by your caregiver.   Ask your caregiver when you can resume driving.   Use eyedrops or other medicines to help healing and control pressure inside your eye as directed by your caregiver.   Only take over-the-counter or prescription medicines for pain, discomfort, or fever as directed by your caregiver.   Do not to touch or rub your eyes.   You may be instructed to use a protective shield during the first few days and nights after surgery. If not, wear sunglasses to protect your eyes. This is to protect the eye from pressure or from being accidentally bumped.   Keep the area around your eye clean and dry. Avoid swimming or allowing water to hit you directly in the face while showering. Keep soap and shampoo out of your eyes.   Do not bend or lift heavy objects. Bending increases pressure in the eye. You can walk, climb stairs, and do light household chores.   Do not put a contact lens into the eye that had surgery until your caregiver says it is okay to do so.    Ask your doctor when you can return to work. This will depend on the kind of work that you do. If you work in a dusty environment, you may be advised to wear protective eyewear for a period of time.   Ask your caregiver when it will be safe to engage in sexual activity.   Continue with your regular eye exams as directed by your caregiver.  What to expect:  It is normal to feel itching and mild discomfort for a few days after cataract surgery. Some fluid discharge is also common, and your eye may be sensitive to light and touch.   After 1 to 2 days, even moderate discomfort should disappear. In most cases, healing will take about 6 weeks.   If you received an intraocular lens (IOL), you may notice that colors are very bright or have a blue tinge. Also, if you have been in  bright sunlight, everything may appear reddish for a few hours. If you see these color tinges, it is because your lens is clear and no longer cloudy. Within a few months after receiving an IOL, these extra colors should go away. When you have healed, you will probably need new glasses.  SEEK MEDICAL CARE IF:   You have increased bruising around your eye.   You have discomfort not helped by medicine.  SEEK IMMEDIATE MEDICAL CARE IF:   You have a fever.   You have a worsening or sudden vision loss.   You have redness, swelling, or increasing pain in the eye.   You have a thick discharge from the eye that had surgery.  MAKE SURE YOU:  Understand these instructions.   Will watch your condition.   Will get help right away if you are not doing well or get worse.  Document Released: 10/25/2004 Document Revised: 03/27/2011 Document Reviewed: 11/29/2010 [facility] Patient Information 2012 [place].    Monitored Anesthesia Care  Monitored anesthesia care is an anesthesia service for a medical procedure. Anesthesia is the loss of the ability to feel pain. It is produced by medications called anesthetics. It may  affect a small area of your body (local anesthesia), a large area of your body (regional anesthesia), or your entire body (general anesthesia). The need for monitored anesthesia care depends your procedure, your condition, and the potential need for regional or general anesthesia. It is often provided during procedures where:   General anesthesia may be needed if there are complications. This is because you need special care when you are under general anesthesia.   You will be under local or regional anesthesia. This is so that you are able to have higher levels of anesthesia if needed.   You will receive calming medications (sedatives). This is especially the case if sedatives are given to put you in a semi-conscious state of relaxation (deep sedation). This is because the amount of sedative needed to produce this state can be hard to predict. Too much of a sedative can produce general anesthesia. Monitored anesthesia care is performed by one or more caregivers who have special training in all types of anesthesia. You will need to meet with these caregivers before your procedure. During this meeting, they will ask you about your medical history. They will also give you instructions to follow. (For example, you will need to stop eating and drinking before your procedure. You may also need to stop or change medications you are taking.) During your procedure, your caregivers will stay with you. They will:   Watch your condition. This includes watching you blood pressure, breathing, and level of pain.   Diagnose and treat problems that occur.   Give medications if they are needed. These may include calming medications (sedatives) and anesthetics.   Make sure you are comfortable.  Having monitored anesthesia care does not necessarily mean that you will be under anesthesia. It does mean that your caregivers will be able to manage anesthesia if you need it or if it occurs. It also means that you will  be able to have a different type of anesthesia than you are having if you need it. When your procedure is complete, your caregivers will continue to watch your condition. They will make sure any medications wear off before you are allowed to go home.  Document Released: 01/01/2005 Document Revised: 08/02/2012 Document Reviewed: 05/19/2012 [facility] Patient Information 2014 [place], [place].

## 2013-10-03 ENCOUNTER — Encounter (HOSPITAL_COMMUNITY)
Admission: RE | Admit: 2013-10-03 | Discharge: 2013-10-03 | Disposition: A | Discharge status: Home / Self Care | Payer: Medicare PPO | Source: Ambulatory Visit | Attending: Ophthalmology | Admitting: Ophthalmology

## 2013-10-04 NOTE — Patient Instructions (Signed)
Your procedure is scheduled on: 10/10/2013  [facility] at  1200  PM.  Call this number if you have problems the morning of surgery: [phone]   Do not eat food or drink liquids :After Midnight.      Take these medicines the morning of surgery with A SIP OF WATER: xanax, paxil, synthroid   Do not wear jewelry, make-up or nail polish.  Do not wear lotions, powders, or perfumes.   Do not shave 48 hours prior to surgery.  Do not bring valuables to the hospital.  Contacts, dentures or bridgework may not be worn into surgery.  Leave suitcase in the car. After surgery it may be brought to your room.  For patients admitted to the hospital, checkout time is 11:00 AM the day of discharge.   Patients discharged the day of surgery will not be allowed to drive home.  :     Please read over the following fact sheets that you were given: Coughing and Deep Breathing, Surgical Site Infection Prevention, Anesthesia Post-op Instructions and Care and Recovery After Surgery    Cataract A cataract is a clouding of the lens of the eye. When a lens becomes cloudy, vision is reduced based on the degree and nature of the clouding. Many cataracts reduce vision to some degree. Some cataracts make people more near-sighted as they develop. Other cataracts increase glare. Cataracts that are ignored and become worse can sometimes look white. The white color can be seen through the pupil. CAUSES   Aging. However, cataracts may occur at any age, even in newborns.   Certain drugs.   Trauma to the eye.   Certain diseases such as diabetes.   Specific eye diseases such as chronic inflammation inside the eye or a sudden attack of a rare form of glaucoma.   Inherited or acquired medical problems.  SYMPTOMS   Gradual, progressive drop in vision in the affected eye.   Severe, rapid visual loss. This most often happens when trauma is the cause.  DIAGNOSIS  To detect a cataract, an eye doctor examines the  lens. Cataracts are best diagnosed with an exam of the eyes with the pupils enlarged (dilated) by drops.  TREATMENT  For an early cataract, vision may improve by using different eyeglasses or stronger lighting. If that does not help your vision, surgery is the only effective treatment. A cataract needs to be surgically removed when vision loss interferes with your everyday activities, such as driving, reading, or watching TV. A cataract may also have to be removed if it prevents examination or treatment of another eye problem. Surgery removes the cloudy lens and usually replaces it with a substitute lens (intraocular lens, IOL).  At a time when both you and your doctor agree, the cataract will be surgically removed. If you have cataracts in both eyes, only one is usually removed at a time. This allows the operated eye to heal and be out of danger from any possible problems after surgery (such as infection or poor wound healing). In rare cases, a cataract may be doing damage to your eye. In these cases, your caregiver may advise surgical removal right away. The vast majority of people who have cataract surgery have better vision afterward. HOME CARE INSTRUCTIONS  If you are not planning surgery, you may be asked to do the following:  Use different eyeglasses.   Use stronger or brighter lighting.   Ask your eye doctor about reducing your medicine dose or changing  medicines if it is thought that a medicine caused your cataract. Changing medicines does not make the cataract go away on its own.   Become familiar with your surroundings. Poor vision can lead to injury. Avoid bumping into things on the affected side. You are at a higher risk for tripping or falling.   Exercise extreme care when driving or operating machinery.   Wear sunglasses if you are sensitive to bright light or experiencing problems with glare.  SEEK IMMEDIATE MEDICAL CARE IF:   You have a worsening or sudden vision loss.   You  notice redness, swelling, or increasing pain in the eye.   You have a fever.  Document Released: 04/07/2005 Document Revised: 03/27/2011 Document Reviewed: 11/29/2010 [facility] Patient Information 2012 [place].PATIENT INSTRUCTIONS POST-ANESTHESIA  IMMEDIATELY FOLLOWING SURGERY:  Do not drive or operate machinery for the first twenty four hours after surgery.  Do not make any important decisions for twenty four hours after surgery or while taking narcotic pain medications or sedatives.  If you develop intractable nausea and vomiting or a severe headache please notify your doctor immediately.  FOLLOW-UP:  Please make an appointment with your surgeon as instructed. You do not need to follow up with anesthesia unless specifically instructed to do so.  WOUND CARE INSTRUCTIONS (if applicable):  Keep a dry clean dressing on the anesthesia/puncture wound site if there is drainage.  Once the wound has quit draining you may leave it open to air.  Generally you should leave the bandage intact for twenty four hours unless there is drainage.  If the epidural site drains for more than 36-48 hours please call the anesthesia department.  QUESTIONS?:  Please feel free to call your physician or the hospital operator if you have any questions, and they will be happy to assist you.

## 2013-10-06 ENCOUNTER — Encounter (HOSPITAL_COMMUNITY)
Admission: RE | Admit: 2013-10-06 | Discharge: 2013-10-06 | Disposition: A | Discharge status: Home / Self Care | Payer: Medicare PPO | Source: Ambulatory Visit | Attending: Ophthalmology | Admitting: Ophthalmology

## 2013-10-10 ENCOUNTER — Ambulatory Visit (HOSPITAL_COMMUNITY): Admission: RE | Admit: 2013-10-10 | Payer: Medicare PPO | Source: Ambulatory Visit | Admitting: Ophthalmology

## 2013-10-10 ENCOUNTER — Encounter (HOSPITAL_COMMUNITY): Admission: RE | Payer: Self-pay | Source: Ambulatory Visit

## 2013-10-10 SURGERY — PHACOEMULSIFICATION, CATARACT, WITH IOL INSERTION
Anesthesia: Monitor Anesthesia Care | Site: Eye | Laterality: Right

## 2013-11-02 ENCOUNTER — Encounter (HOSPITAL_COMMUNITY): Payer: Self-pay | Admitting: Pharmacy Technician

## 2013-11-08 ENCOUNTER — Encounter (HOSPITAL_COMMUNITY): Admission: RE | Admit: 2013-11-08 | Payer: Medicare PPO | Source: Ambulatory Visit

## 2013-11-09 ENCOUNTER — Encounter (HOSPITAL_COMMUNITY)
Admission: RE | Admit: 2013-11-09 | Discharge: 2013-11-09 | Disposition: A | Discharge status: Home / Self Care | Payer: Medicare PPO | Source: Ambulatory Visit | Attending: Ophthalmology | Admitting: Ophthalmology

## 2013-11-09 ENCOUNTER — Encounter (HOSPITAL_COMMUNITY): Payer: Self-pay

## 2013-11-09 ENCOUNTER — Other Ambulatory Visit: Payer: Self-pay

## 2013-11-09 DIAGNOSIS — Z01810 Encounter for preprocedural cardiovascular examination: Secondary | ICD-10-CM | POA: Insufficient documentation

## 2013-11-09 DIAGNOSIS — Z01812 Encounter for preprocedural laboratory examination: Secondary | ICD-10-CM | POA: Insufficient documentation

## 2013-11-09 HISTORY — DX: Hypothyroidism, unspecified: E03.9

## 2013-11-09 HISTORY — DX: Unspecified osteoarthritis, unspecified site: M19.90

## 2013-11-09 LAB — BASIC METABOLIC PANEL
ANION GAP: 9 (ref 5–15)
BUN: 19 mg/dL (ref 6–23)
CALCIUM: 9.6 mg/dL (ref 8.4–10.5)
CHLORIDE: 106 meq/L (ref 96–112)
CO2: 33 mEq/L — ABNORMAL HIGH (ref 19–32)
CREATININE: 0.98 mg/dL (ref 0.50–1.10)
GFR calc non Af Amer: 55 mL/min — ABNORMAL LOW (ref >90)
GFR, EST AFRICAN AMERICAN: 64 mL/min — AB (ref >90)
Glucose, Bld: 91 mg/dL (ref 70–99)
Potassium: 4.5 mEq/L (ref 3.7–5.3)
Sodium: 148 mEq/L — ABNORMAL HIGH (ref 137–147)

## 2013-11-09 LAB — HEMOGLOBIN AND HEMATOCRIT, BLOOD
HCT: 39.5 % (ref 36.0–46.0)
Hemoglobin: 12.4 g/dL (ref 12.0–15.0)

## 2013-11-09 NOTE — Patient Instructions (Signed)
Your procedure is scheduled on: 11/14/2013  [facility].  Call this number if you have problems the morning of surgery: [phone]   Do not eat food or drink liquids :After Midnight.      Take these medicines the morning of surgery with A SIP OF WATER: levothyroxine, xanax, paxil, quvar. Take albuterol before you come.   Do not wear jewelry, make-up or nail polish.  Do not wear lotions, powders, or perfumes.   Do not shave 48 hours prior to surgery.  Do not bring valuables to the hospital.  Contacts, dentures or bridgework may not be worn into surgery.  Leave suitcase in the car. After surgery it may be brought to your room.  For patients admitted to the hospital, checkout time is 11:00 AM the day of discharge.   Patients discharged the day of surgery will not be allowed to drive home.  :     Please read over the following fact sheets that you were given: Coughing and Deep Breathing, Surgical Site Infection Prevention, Anesthesia Post-op Instructions and Care and Recovery After Surgery    Cataract A cataract is a clouding of the lens of the eye. When a lens becomes cloudy, vision is reduced based on the degree and nature of the clouding. Many cataracts reduce vision to some degree. Some cataracts make people more near-sighted as they develop. Other cataracts increase glare. Cataracts that are ignored and become worse can sometimes look white. The white color can be seen through the pupil. CAUSES   Aging. However, cataracts may occur at any age, even in newborns.   Certain drugs.   Trauma to the eye.   Certain diseases such as diabetes.   Specific eye diseases such as chronic inflammation inside the eye or a sudden attack of a rare form of glaucoma.   Inherited or acquired medical problems.  SYMPTOMS   Gradual, progressive drop in vision in the affected eye.   Severe, rapid visual loss. This most often happens when trauma is the cause.  DIAGNOSIS  To  detect a cataract, an eye doctor examines the lens. Cataracts are best diagnosed with an exam of the eyes with the pupils enlarged (dilated) by drops.  TREATMENT  For an early cataract, vision may improve by using different eyeglasses or stronger lighting. If that does not help your vision, surgery is the only effective treatment. A cataract needs to be surgically removed when vision loss interferes with your everyday activities, such as driving, reading, or watching TV. A cataract may also have to be removed if it prevents examination or treatment of another eye problem. Surgery removes the cloudy lens and usually replaces it with a substitute lens (intraocular lens, IOL).  At a time when both you and your doctor agree, the cataract will be surgically removed. If you have cataracts in both eyes, only one is usually removed at a time. This allows the operated eye to heal and be out of danger from any possible problems after surgery (such as infection or poor wound healing). In rare cases, a cataract may be doing damage to your eye. In these cases, your caregiver may advise surgical removal right away. The vast majority of people who have cataract surgery have better vision afterward. HOME CARE INSTRUCTIONS  If you are not planning surgery, you may be asked to do the following:  Use different eyeglasses.   Use stronger or brighter lighting.   Ask your eye doctor about  reducing your medicine dose or changing medicines if it is thought that a medicine caused your cataract. Changing medicines does not make the cataract go away on its own.   Become familiar with your surroundings. Poor vision can lead to injury. Avoid bumping into things on the affected side. You are at a higher risk for tripping or falling.   Exercise extreme care when driving or operating machinery.   Wear sunglasses if you are sensitive to bright light or experiencing problems with glare.  SEEK IMMEDIATE MEDICAL CARE IF:   You have  a worsening or sudden vision loss.   You notice redness, swelling, or increasing pain in the eye.   You have a fever.  Document Released: 04/07/2005 Document Revised: 03/27/2011 Document Reviewed: 11/29/2010 [facility] Patient Information 2012 [place].PATIENT INSTRUCTIONS POST-ANESTHESIA  IMMEDIATELY FOLLOWING SURGERY:  Do not drive or operate machinery for the first twenty four hours after surgery.  Do not make any important decisions for twenty four hours after surgery or while taking narcotic pain medications or sedatives.  If you develop intractable nausea and vomiting or a severe headache please notify your doctor immediately.  FOLLOW-UP:  Please make an appointment with your surgeon as instructed. You do not need to follow up with anesthesia unless specifically instructed to do so.  WOUND CARE INSTRUCTIONS (if applicable):  Keep a dry clean dressing on the anesthesia/puncture wound site if there is drainage.  Once the wound has quit draining you may leave it open to air.  Generally you should leave the bandage intact for twenty four hours unless there is drainage.  If the epidural site drains for more than 36-48 hours please call the anesthesia department.  QUESTIONS?:  Please feel free to call your physician or the hospital operator if you have any questions, and they will be happy to assist you.

## 2013-11-09 NOTE — Pre-Procedure Instructions (Signed)
Patient given information to sign up for my chart at home. 

## 2013-11-11 MED ORDER — LIDOCAINE HCL 3.5 % OP GEL
OPHTHALMIC | Status: AC
  Filled 2013-11-11: qty 1

## 2013-11-11 MED ORDER — CYCLOPENTOLATE-PHENYLEPHRINE OP SOLN OPTIME - NO CHARGE
OPHTHALMIC | Status: AC
  Filled 2013-11-11: qty 2

## 2013-11-11 MED ORDER — TETRACAINE HCL 0.5 % OP SOLN
OPHTHALMIC | Status: AC
  Filled 2013-11-11: qty 2

## 2013-11-11 MED ORDER — NEOMYCIN-POLYMYXIN-DEXAMETH 3.5-10000-0.1 OP SUSP
OPHTHALMIC | Status: AC
  Filled 2013-11-11: qty 5

## 2013-11-11 MED ORDER — LIDOCAINE HCL (PF) 1 % IJ SOLN
INTRAMUSCULAR | Status: AC
  Filled 2013-11-11: qty 2

## 2013-11-11 MED ORDER — PHENYLEPHRINE HCL 2.5 % OP SOLN
OPHTHALMIC | Status: AC
  Filled 2013-11-11: qty 15

## 2013-11-14 ENCOUNTER — Encounter (HOSPITAL_COMMUNITY): Payer: Self-pay | Admitting: *Deleted

## 2013-11-14 ENCOUNTER — Ambulatory Visit (HOSPITAL_COMMUNITY): Payer: Medicare PPO | Admitting: Anesthesiology

## 2013-11-14 ENCOUNTER — Encounter (HOSPITAL_COMMUNITY): Payer: Medicare PPO | Admitting: Anesthesiology

## 2013-11-14 ENCOUNTER — Encounter (HOSPITAL_COMMUNITY)
Admission: RE | Disposition: A | Discharge status: Home / Self Care | Payer: Self-pay | Source: Ambulatory Visit | Attending: Ophthalmology

## 2013-11-14 ENCOUNTER — Ambulatory Visit (HOSPITAL_COMMUNITY)
Admission: RE | Admit: 2013-11-14 | Discharge: 2013-11-14 | Disposition: A | Discharge status: Home / Self Care | Payer: Medicare PPO | Source: Ambulatory Visit | Attending: Ophthalmology | Admitting: Ophthalmology

## 2013-11-14 DIAGNOSIS — E039 Hypothyroidism, unspecified: Secondary | ICD-10-CM | POA: Diagnosis not present

## 2013-11-14 DIAGNOSIS — F319 Bipolar disorder, unspecified: Secondary | ICD-10-CM | POA: Diagnosis not present

## 2013-11-14 DIAGNOSIS — F17200 Nicotine dependence, unspecified, uncomplicated: Secondary | ICD-10-CM | POA: Diagnosis not present

## 2013-11-14 DIAGNOSIS — J449 Chronic obstructive pulmonary disease, unspecified: Secondary | ICD-10-CM | POA: Diagnosis not present

## 2013-11-14 DIAGNOSIS — Z79899 Other long term (current) drug therapy: Secondary | ICD-10-CM | POA: Diagnosis not present

## 2013-11-14 DIAGNOSIS — H2510 Age-related nuclear cataract, unspecified eye: Secondary | ICD-10-CM | POA: Insufficient documentation

## 2013-11-14 DIAGNOSIS — J4489 Other specified chronic obstructive pulmonary disease: Secondary | ICD-10-CM | POA: Insufficient documentation

## 2013-11-14 HISTORY — PX: CATARACT EXTRACTION W/PHACO: SHX586

## 2013-11-14 SURGERY — PHACOEMULSIFICATION, CATARACT, WITH IOL INSERTION
Anesthesia: Monitor Anesthesia Care | Site: Eye | Laterality: Right

## 2013-11-14 MED ORDER — MIDAZOLAM HCL 2 MG/2ML IJ SOLN
1.0000 mg | INTRAMUSCULAR | Status: DC | PRN
  Administered 2013-11-14: 2 mg via INTRAVENOUS

## 2013-11-14 MED ORDER — LIDOCAINE HCL (PF) 1 % IJ SOLN: INTRAMUSCULAR | Status: DC | PRN

## 2013-11-14 MED ORDER — LACTATED RINGERS IV SOLN
INTRAVENOUS | Status: DC
  Administered 2013-11-14: 09:00:00 via INTRAVENOUS

## 2013-11-14 MED ORDER — LACTATED RINGERS IV SOLN
INTRAVENOUS | Status: DC | PRN
  Administered 2013-11-14: 08:00:00 via INTRAVENOUS

## 2013-11-14 MED ORDER — LIDOCAINE 3.5 % OP GEL OPTIME - NO CHARGE
OPHTHALMIC | Status: DC | PRN
  Administered 2013-11-14: 1 [drp] via OPHTHALMIC

## 2013-11-14 MED ORDER — POVIDONE-IODINE 5 % OP SOLN
OPHTHALMIC | Status: DC | PRN
Start: 2013-11-14 — End: 2013-11-14
  Administered 2013-11-14: 1 via OPHTHALMIC

## 2013-11-14 MED ORDER — CYCLOPENTOLATE-PHENYLEPHRINE 0.2-1 % OP SOLN
1.0000 [drp] | OPHTHALMIC | Status: AC | PRN
  Administered 2013-11-14 (×3): 1 [drp] via OPHTHALMIC

## 2013-11-14 MED ORDER — FENTANYL CITRATE 0.05 MG/ML IJ SOLN
INTRAMUSCULAR | Status: AC
  Filled 2013-11-14: qty 2

## 2013-11-14 MED ORDER — PHENYLEPHRINE HCL 2.5 % OP SOLN
1.0000 [drp] | OPHTHALMIC | Status: AC | PRN
  Administered 2013-11-14 (×3): 1 [drp] via OPHTHALMIC

## 2013-11-14 MED ORDER — PROVISC 10 MG/ML IO SOLN
INTRAOCULAR | Status: DC | PRN
  Administered 2013-11-14: 0.85 mL via INTRAOCULAR

## 2013-11-14 MED ORDER — EPINEPHRINE HCL 1 MG/ML IJ SOLN
INTRAMUSCULAR | Status: AC
  Filled 2013-11-14: qty 1

## 2013-11-14 MED ORDER — NEOMYCIN-POLYMYXIN-DEXAMETH 3.5-10000-0.1 OP SUSP
OPHTHALMIC | Status: DC | PRN
  Administered 2013-11-14: 1 [drp] via OPHTHALMIC

## 2013-11-14 MED ORDER — LIDOCAINE HCL (PF) 1 % IJ SOLN
INTRAOCULAR | Status: DC | PRN
  Administered 2013-11-14: 09:00:00 via OPHTHALMIC

## 2013-11-14 MED ORDER — BSS IO SOLN
INTRAOCULAR | Status: DC | PRN
  Administered 2013-11-14: 15 mL

## 2013-11-14 MED ORDER — LIDOCAINE HCL 3.5 % OP GEL
1.0000 "application " | Freq: Once | OPHTHALMIC | Status: AC
  Administered 2013-11-14: 1 via OPHTHALMIC

## 2013-11-14 MED ORDER — TETRACAINE HCL 0.5 % OP SOLN
1.0000 [drp] | OPHTHALMIC | Status: AC | PRN
  Administered 2013-11-14 (×3): 1 [drp] via OPHTHALMIC

## 2013-11-14 MED ORDER — BSS IO SOLN
INTRAOCULAR | Status: DC | PRN
  Administered 2013-11-14: 09:00:00

## 2013-11-14 MED ORDER — FENTANYL CITRATE 0.05 MG/ML IJ SOLN
25.0000 ug | INTRAMUSCULAR | Status: AC
  Administered 2013-11-14: 25 ug via INTRAVENOUS

## 2013-11-14 MED ORDER — MIDAZOLAM HCL 2 MG/2ML IJ SOLN
INTRAMUSCULAR | Status: AC
  Filled 2013-11-14: qty 2

## 2013-11-14 SURGICAL SUPPLY — 11 items
CLOTH BEACON ORANGE TIMEOUT ST (SAFETY) ×2 IMPLANT
EYE SHIELD UNIVERSAL CLEAR (GAUZE/BANDAGES/DRESSINGS) ×2 IMPLANT
GLOVE BIOGEL PI IND STRL 7.0 (GLOVE) ×1 IMPLANT
GLOVE BIOGEL PI INDICATOR 7.0 (GLOVE) ×1
GLOVE EXAM NITRILE MD LF STRL (GLOVE) ×2 IMPLANT
PAD ARMBOARD 7.5X6 YLW CONV (MISCELLANEOUS) ×2 IMPLANT
SIGHTPATH CAT PROC W REG LENS (Ophthalmic Related) ×2 IMPLANT
SYRINGE LUER LOK 1CC (MISCELLANEOUS) ×2 IMPLANT
TAPE SURG TRANSPORE 1 IN (GAUZE/BANDAGES/DRESSINGS) ×1 IMPLANT
TAPE SURGICAL TRANSPORE 1 IN (GAUZE/BANDAGES/DRESSINGS) ×1
WATER STERILE IRR 250ML POUR (IV SOLUTION) ×2 IMPLANT

## 2013-11-14 NOTE — Anesthesia Procedure Notes (Signed)
Procedure Name: MAC Date/Time: 11/14/2013 9:00 AM Performed by: Pernell DupreADAMS, AMY A Pre-anesthesia Checklist: Patient identified, Timeout performed, Emergency Drugs available, Suction available and Patient being monitored Oxygen Delivery Method: Nasal cannula

## 2013-11-14 NOTE — Anesthesia Preprocedure Evaluation (Signed)
Anesthesia Evaluation  Patient identified by MRN, date of birth, ID band Patient awake    Reviewed: Allergy & Precautions, H&P , NPO status , Patient's Chart, lab work & pertinent test results  Airway Mallampati: III TM Distance: >3 FB     Dental  (+) Edentulous Upper, Edentulous Lower   Pulmonary COPD COPD inhaler, Current Smoker,  breath sounds clear to auscultation        Cardiovascular negative cardio ROS  Rhythm:Regular Rate:Normal     Neuro/Psych PSYCHIATRIC DISORDERS Bipolar Disorder    GI/Hepatic negative GI ROS,   Endo/Other  Hypothyroidism   Renal/GU      Musculoskeletal   Abdominal   Peds  Hematology   Anesthesia Other Findings   Reproductive/Obstetrics                           Anesthesia Physical Anesthesia Plan  ASA: III  Anesthesia Plan: MAC   Post-op Pain Management:    Induction: Intravenous  Airway Management Planned: Nasal Cannula  Additional Equipment:   Intra-op Plan:   Post-operative Plan:   Informed Consent: I have reviewed the patients History and Physical, chart, labs and discussed the procedure including the risks, benefits and alternatives for the proposed anesthesia with the patient or authorized representative who has indicated his/her understanding and acceptance.     Plan Discussed with:   Anesthesia Plan Comments:         Anesthesia Quick Evaluation

## 2013-11-14 NOTE — Op Note (Signed)
Date of Admission: 11/14/2013  Date of Surgery: 11/14/2013   Pre-Op Dx: Cataract Right Eye  Post-Op Dx: Senile Nuclear Cataract Right  Eye,  Dx Code 366.16  Surgeon: Gemma PayorKerry Kailynne Ferrington, M.D.  Assistants: None  Anesthesia: Topical with MAC  Indications: Painless, progressive loss of vision with compromise of daily activities.  Surgery: Cataract Extraction with Intraocular lens Implant Right Eye  Discription: The patient had dilating drops and viscous lidocaine placed into the Right eye in the pre-op holding area. After transfer to the operating room, a time out was performed. The patient was then prepped and draped. Beginning with a 75 degree blade a paracentesis port was made at the surgeon's 2 o'clock position. The anterior chamber was then filled with 1% non-preserved lidocaine with epi. This was followed by filling the anterior chamber with Provisc.  A 2.124mm keratome blade was used to make a clear corneal incision at the temporal limbus.  A bent cystatome needle was used to create a continuous tear capsulotomy. Hydrodissection was performed with balanced salt solution on a Fine canula. The lens nucleus was then removed using the phacoemulsification handpiece. Residual cortex was removed with the I&A handpiece. The anterior chamber and capsular bag were refilled with Provisc. A posterior chamber intraocular lens was placed into the capsular bag with it's injector. The implant was positioned with the Kuglan hook. The Provisc was then removed from the anterior chamber and capsular bag with the I&A handpiece. Stromal hydration of the main incision and paracentesis port was performed with BSS on a Fine canula. The wounds were tested for leak which was negative. The patient tolerated the procedure well. There were no operative complications. The patient was then transferred to the recovery room in stable condition.  Complications: None  Specimen: None  EBL: None  Prosthetic device: Hoya iSert 250, power  25.5 D, SN [id number].

## 2013-11-14 NOTE — Discharge Instructions (Signed)
Cataract Surgery °Care After °Refer to this sheet in the next few weeks. These instructions provide you with information on caring for yourself after your procedure. Your caregiver may also give you more specific instructions. Your treatment has been planned according to current medical practices, but problems sometimes occur. Call your caregiver if you have any problems or questions after your procedure.  °HOME CARE INSTRUCTIONS  °· Avoid strenuous activities as directed by your caregiver. °· Ask your caregiver when you can resume driving. °· Use eyedrops or other medicines to help healing and control pressure inside your eye as directed by your caregiver. °· Only take over-the-counter or prescription medicines for pain, discomfort, or fever as directed by your caregiver. °· Do not to touch or rub your eyes. °· You may be instructed to use a protective shield during the first few days and nights after surgery. If not, wear sunglasses to protect your eyes. This is to protect the eye from pressure or from being accidentally bumped. °· Keep the area around your eye clean and dry. Avoid swimming or allowing water to hit you directly in the face while showering. Keep soap and shampoo out of your eyes. °· Do not bend or lift heavy objects. Bending increases pressure in the eye. You can walk, climb stairs, and do light household chores. °· Do not put a contact lens into the eye that had surgery until your caregiver says it is okay to do so. °· Ask your doctor when you can return to work. This will depend on the kind of work that you do. If you work in a dusty environment, you may be advised to wear protective eyewear for a period of time. °· Ask your caregiver when it will be safe to engage in sexual activity. °· Continue with your regular eye exams as directed by your caregiver. °What to expect: °· It is normal to feel itching and mild discomfort for a few days after cataract surgery. Some fluid discharge is also common,  and your eye may be sensitive to light and touch. °· After 1 to 2 days, even moderate discomfort should disappear. In most cases, healing will take about 6 weeks. °· If you received an intraocular lens (IOL), you may notice that colors are very bright or have a blue tinge. Also, if you have been in bright sunlight, everything may appear reddish for a few hours. If you see these color tinges, it is because your lens is clear and no longer cloudy. Within a few months after receiving an IOL, these extra colors should go away. When you have healed, you will probably need new glasses. °SEEK MEDICAL CARE IF:  °· You have increased bruising around your eye. °· You have discomfort not helped by medicine. °SEEK IMMEDIATE MEDICAL CARE IF:  °· You have a  fever. °· You have a worsening or sudden vision loss. °· You have redness, swelling, or increasing pain in the eye. °· You have a thick discharge from the eye that had surgery. °MAKE SURE YOU: °· Understand these instructions. °· Will watch your condition. °· Will get help right away if you are not doing well or get worse. °Document Released: 10/25/2004 Document Revised: 06/30/2011 Document Reviewed: 11/29/2010 °[employer]® Patient Information ©2015 [employer]. This information is not intended to replace advice given to you by your health care provider. Make sure you discuss any questions you have with your health care provider. ° ° ° °PATIENT INSTRUCTIONS °POST-ANESTHESIA ° °IMMEDIATELY FOLLOWING SURGERY:  Do not   drive or operate machinery for the first twenty four hours after surgery.  Do not make any important decisions for twenty four hours after surgery or while taking narcotic pain medications or sedatives.  If you develop intractable nausea and vomiting or a severe headache please notify your doctor immediately. ° °FOLLOW-UP:  Please make an appointment with your surgeon as instructed. You do not need to follow up with anesthesia unless specifically instructed to do  so. ° °WOUND CARE INSTRUCTIONS (if applicable):  Keep a dry clean dressing on the anesthesia/puncture wound site if there is drainage.  Once the wound has quit draining you may leave it open to air.  Generally you should leave the bandage intact for twenty four hours unless there is drainage.  If the epidural site drains for more than 36-48 hours please call the anesthesia department. ° °QUESTIONS?:  Please feel free to call your physician or the hospital operator if you have any questions, and they will be happy to assist you.    ° ° ° °

## 2013-11-14 NOTE — H&P (Signed)
I have reviewed the H&P, the patient was re-examined, and I have identified no interval changes in medical condition and plan of care since the history and physical of record  

## 2013-11-14 NOTE — Anesthesia Postprocedure Evaluation (Signed)
  Anesthesia Post-op Note  Patient: Kristen Sanchez  Procedure(s) Performed: Procedure(s): CATARACT EXTRACTION PHACO AND INTRAOCULAR LENS PLACEMENT RIGHT EYE CDE=14.26 (Right)  Patient Location: Short Stay  Anesthesia Type:MAC  Level of Consciousness: awake, alert , oriented and patient cooperative  Airway and Oxygen Therapy: Patient Spontanous Breathing  Post-op Pain: none  Post-op Assessment: Post-op Vital signs reviewed, Patient's Cardiovascular Status Stable, Respiratory Function Stable, Patent Airway, No signs of Nausea or vomiting and Pain level controlled  Post-op Vital Signs: Reviewed and stable  Last Vitals:  Filed Vitals:   11/14/13 0900  BP: 181/72  Temp:   Resp:     Complications: No apparent anesthesia complications

## 2013-11-14 NOTE — Transfer of Care (Signed)
Immediate Anesthesia Transfer of Care Note  Patient: Kristen Sanchez  Procedure(s) Performed: Procedure(s): CATARACT EXTRACTION PHACO AND INTRAOCULAR LENS PLACEMENT RIGHT EYE CDE=14.26 (Right)  Patient Location: Short Stay  Anesthesia Type:MAC  Level of Consciousness: awake, alert , oriented and patient cooperative  Airway & Oxygen Therapy: Patient Spontanous Breathing  Post-op Assessment: Report given to PACU RN and Post -op Vital signs reviewed and stable  Post vital signs: Reviewed and stable  Complications: No apparent anesthesia complications

## 2013-11-15 ENCOUNTER — Encounter (HOSPITAL_COMMUNITY): Payer: Self-pay | Admitting: Ophthalmology

## 2013-11-18 ENCOUNTER — Encounter (HOSPITAL_COMMUNITY): Payer: Self-pay | Admitting: Pharmacy Technician

## 2013-11-22 ENCOUNTER — Encounter (HOSPITAL_COMMUNITY): Payer: Self-pay

## 2013-11-22 ENCOUNTER — Encounter (HOSPITAL_COMMUNITY)
Admission: RE | Admit: 2013-11-22 | Discharge: 2013-11-22 | Disposition: A | Discharge status: Home / Self Care | Payer: Medicare PPO | Source: Ambulatory Visit | Attending: Ophthalmology | Admitting: Ophthalmology

## 2013-11-22 MED ORDER — FENTANYL CITRATE 0.05 MG/ML IJ SOLN: 25.0000 ug | INTRAMUSCULAR | Status: DC | PRN

## 2013-11-22 MED ORDER — ONDANSETRON HCL 4 MG/2ML IJ SOLN: 4.0000 mg | Freq: Once | INTRAMUSCULAR | Status: AC | PRN

## 2013-11-25 MED ORDER — PHENYLEPHRINE HCL 2.5 % OP SOLN
OPHTHALMIC | Status: AC
  Filled 2013-11-25: qty 15

## 2013-11-25 MED ORDER — LIDOCAINE HCL (PF) 1 % IJ SOLN
INTRAMUSCULAR | Status: AC
  Filled 2013-11-25: qty 2

## 2013-11-25 MED ORDER — TETRACAINE HCL 0.5 % OP SOLN
OPHTHALMIC | Status: AC
  Filled 2013-11-25: qty 2

## 2013-11-25 MED ORDER — LIDOCAINE HCL 3.5 % OP GEL
OPHTHALMIC | Status: AC
  Filled 2013-11-25: qty 1

## 2013-11-25 MED ORDER — CYCLOPENTOLATE-PHENYLEPHRINE OP SOLN OPTIME - NO CHARGE
OPHTHALMIC | Status: AC
  Filled 2013-11-25: qty 2

## 2013-11-25 MED ORDER — NEOMYCIN-POLYMYXIN-DEXAMETH 3.5-10000-0.1 OP SUSP
OPHTHALMIC | Status: AC
  Filled 2013-11-25: qty 5

## 2013-11-28 ENCOUNTER — Encounter (HOSPITAL_COMMUNITY): Payer: Medicare PPO | Admitting: Anesthesiology

## 2013-11-28 ENCOUNTER — Encounter (HOSPITAL_COMMUNITY)
Admission: RE | Disposition: A | Discharge status: Home / Self Care | Payer: Self-pay | Source: Ambulatory Visit | Attending: Ophthalmology

## 2013-11-28 ENCOUNTER — Encounter (HOSPITAL_COMMUNITY): Payer: Self-pay | Admitting: *Deleted

## 2013-11-28 ENCOUNTER — Ambulatory Visit (HOSPITAL_COMMUNITY)
Admission: RE | Admit: 2013-11-28 | Discharge: 2013-11-28 | Disposition: A | Discharge status: Home / Self Care | Payer: Medicare PPO | Source: Ambulatory Visit | Attending: Ophthalmology | Admitting: Ophthalmology

## 2013-11-28 ENCOUNTER — Ambulatory Visit (HOSPITAL_COMMUNITY): Payer: Medicare PPO | Admitting: Anesthesiology

## 2013-11-28 DIAGNOSIS — H2510 Age-related nuclear cataract, unspecified eye: Secondary | ICD-10-CM | POA: Insufficient documentation

## 2013-11-28 HISTORY — PX: CATARACT EXTRACTION W/PHACO: SHX586

## 2013-11-28 SURGERY — PHACOEMULSIFICATION, CATARACT, WITH IOL INSERTION
Anesthesia: Monitor Anesthesia Care | Site: Eye | Laterality: Left

## 2013-11-28 MED ORDER — LIDOCAINE HCL 3.5 % OP GEL
1.0000 | Freq: Once | OPHTHALMIC | Status: DC
Start: 2013-11-28 — End: 2013-11-28

## 2013-11-28 MED ORDER — EPINEPHRINE HCL 1 MG/ML IJ SOLN
INTRAMUSCULAR | Status: AC
  Filled 2013-11-28: qty 1

## 2013-11-28 MED ORDER — PHENYLEPHRINE HCL 2.5 % OP SOLN
1.0000 [drp] | OPHTHALMIC | Status: AC
  Administered 2013-11-28 (×3): 1 [drp] via OPHTHALMIC

## 2013-11-28 MED ORDER — PROVISC 10 MG/ML IO SOLN
INTRAOCULAR | Status: DC | PRN
  Administered 2013-11-28: 0.85 mL via INTRAOCULAR

## 2013-11-28 MED ORDER — BSS IO SOLN
INTRAOCULAR | Status: DC | PRN
  Administered 2013-11-28: 15 mL

## 2013-11-28 MED ORDER — TETRACAINE HCL 0.5 % OP SOLN
1.0000 [drp] | OPHTHALMIC | Status: AC
  Administered 2013-11-28 (×3): 1 [drp] via OPHTHALMIC

## 2013-11-28 MED ORDER — EPINEPHRINE HCL 1 MG/ML IJ SOLN
INTRAOCULAR | Status: DC | PRN
  Administered 2013-11-28: 08:00:00

## 2013-11-28 MED ORDER — NEOMYCIN-POLYMYXIN-DEXAMETH 3.5-10000-0.1 OP SUSP
OPHTHALMIC | Status: DC | PRN
  Administered 2013-11-28: 2 [drp] via OPHTHALMIC

## 2013-11-28 MED ORDER — FENTANYL CITRATE 0.05 MG/ML IJ SOLN
INTRAMUSCULAR | Status: AC
  Filled 2013-11-28: qty 2

## 2013-11-28 MED ORDER — MIDAZOLAM HCL 2 MG/2ML IJ SOLN
1.0000 mg | INTRAMUSCULAR | Status: DC | PRN
  Administered 2013-11-28: 2 mg via INTRAVENOUS

## 2013-11-28 MED ORDER — CYCLOPENTOLATE-PHENYLEPHRINE 0.2-1 % OP SOLN
1.0000 [drp] | OPHTHALMIC | Status: AC
  Administered 2013-11-28 (×3): 1 [drp] via OPHTHALMIC

## 2013-11-28 MED ORDER — POVIDONE-IODINE 5 % OP SOLN
OPHTHALMIC | Status: DC | PRN
  Administered 2013-11-28: 1 via OPHTHALMIC

## 2013-11-28 MED ORDER — MIDAZOLAM HCL 2 MG/2ML IJ SOLN
INTRAMUSCULAR | Status: AC
  Filled 2013-11-28: qty 2

## 2013-11-28 MED ORDER — GLYCOPYRROLATE 0.2 MG/ML IJ SOLN
0.2000 mg | Freq: Once | INTRAMUSCULAR | Status: AC
  Administered 2013-11-28: 0.2 mg via INTRAVENOUS
  Filled 2013-11-28: qty 1

## 2013-11-28 MED ORDER — LACTATED RINGERS IV SOLN
INTRAVENOUS | Status: DC
  Administered 2013-11-28: 08:00:00 via INTRAVENOUS

## 2013-11-28 MED ORDER — FENTANYL CITRATE 0.05 MG/ML IJ SOLN
25.0000 ug | INTRAMUSCULAR | Status: AC
  Administered 2013-11-28 (×2): 25 ug via INTRAVENOUS

## 2013-11-28 MED ORDER — EPINEPHRINE HCL 1 MG/ML IJ SOLN
INTRAMUSCULAR | Status: DC | PRN
  Administered 2013-11-28: 08:00:00 via OPHTHALMIC

## 2013-11-28 SURGICAL SUPPLY — 34 items
CAPSULAR TENSION RING-AMO (OPHTHALMIC RELATED) IMPLANT
CLOTH BEACON ORANGE TIMEOUT ST (SAFETY) ×3 IMPLANT
EYE SHIELD UNIVERSAL CLEAR (GAUZE/BANDAGES/DRESSINGS) ×3 IMPLANT
GLOVE BIO SURGEON STRL SZ 6.5 (GLOVE) IMPLANT
GLOVE BIO SURGEONS STRL SZ 6.5 (GLOVE)
GLOVE BIOGEL PI IND STRL 6.5 (GLOVE) IMPLANT
GLOVE BIOGEL PI IND STRL 7.0 (GLOVE) ×1 IMPLANT
GLOVE BIOGEL PI IND STRL 7.5 (GLOVE) IMPLANT
GLOVE BIOGEL PI INDICATOR 6.5 (GLOVE)
GLOVE BIOGEL PI INDICATOR 7.0 (GLOVE) ×2
GLOVE BIOGEL PI INDICATOR 7.5 (GLOVE)
GLOVE ECLIPSE 6.5 STRL STRAW (GLOVE) IMPLANT
GLOVE ECLIPSE 7.0 STRL STRAW (GLOVE) IMPLANT
GLOVE ECLIPSE 7.5 STRL STRAW (GLOVE) IMPLANT
GLOVE EXAM NITRILE LRG STRL (GLOVE) IMPLANT
GLOVE EXAM NITRILE MD LF STRL (GLOVE) ×3 IMPLANT
GLOVE SKINSENSE NS SZ6.5 (GLOVE)
GLOVE SKINSENSE NS SZ7.0 (GLOVE)
GLOVE SKINSENSE STRL SZ6.5 (GLOVE) IMPLANT
GLOVE SKINSENSE STRL SZ7.0 (GLOVE) IMPLANT
KIT VITRECTOMY (OPHTHALMIC RELATED) IMPLANT
PAD ARMBOARD 7.5X6 YLW CONV (MISCELLANEOUS) ×3 IMPLANT
PROC W NO LENS (INTRAOCULAR LENS)
PROC W SPEC LENS (INTRAOCULAR LENS)
PROCESS W NO LENS (INTRAOCULAR LENS) IMPLANT
PROCESS W SPEC LENS (INTRAOCULAR LENS) IMPLANT
RETRACTOR IRIS SIGHTPATH (OPHTHALMIC RELATED) IMPLANT
RING MALYGIN (MISCELLANEOUS) IMPLANT
SIGHTPATH CAT PROC W REG LENS (Ophthalmic Related) ×3 IMPLANT
SYRINGE LUER LOK 1CC (MISCELLANEOUS) ×3 IMPLANT
TAPE SURG TRANSPORE 1 IN (GAUZE/BANDAGES/DRESSINGS) ×1 IMPLANT
TAPE SURGICAL TRANSPORE 1 IN (GAUZE/BANDAGES/DRESSINGS) ×2
VISCOELASTIC ADDITIONAL (OPHTHALMIC RELATED) IMPLANT
WATER STERILE IRR 250ML POUR (IV SOLUTION) ×3 IMPLANT

## 2013-11-28 NOTE — Discharge Instructions (Signed)

## 2013-11-28 NOTE — Transfer of Care (Signed)
Immediate Anesthesia Transfer of Care Note  Patient: Kristen Sanchez  Procedure(s) Performed: Procedure(s) with comments: CATARACT EXTRACTION PHACO AND INTRAOCULAR LENS PLACEMENT (IOC) (Left) - CDE 8.65  Patient Location: Short Stay  Anesthesia Type:MAC  Level of Consciousness: awake, alert  and oriented  Airway & Oxygen Therapy: Patient Spontanous Breathing  Post-op Assessment: Report given to PACU RN  Post vital signs: Reviewed  Complications: No apparent anesthesia complications

## 2013-11-28 NOTE — H&P (Signed)
I have reviewed the H&P, the patient was re-examined, and I have identified no interval changes in medical condition and plan of care since the history and physical of record  

## 2013-11-28 NOTE — Op Note (Signed)
Date of Admission: 11/28/2013  Date of Surgery: 11/28/2013   Pre-Op Dx: Cataract Left Eye  Post-Op Dx: Senile Nuclear  Cataract Left  Eye,  Dx Code 366.16  Surgeon: Gemma PayorKerry Channing Savich, M.D.  Assistants: None  Anesthesia: Topical with MAC  Indications: Painless, progressive loss of vision with compromise of daily activities.  Surgery: Cataract Extraction with Intraocular lens Implant Left Eye  Discription: The patient had dilating drops and viscous lidocaine placed into the Left eye in the pre-op holding area. After transfer to the operating room, a time out was performed. The patient was then prepped and draped. Beginning with a 75 degree blade a paracentesis port was made at the surgeon's 2 o'clock position. The anterior chamber was then filled with 1% non-preserved lidocaine. This was followed by filling the anterior chamber with Provisc.  A 2.864mm keratome blade was used to make a clear corneal incision at the temporal limbus.  A bent cystatome needle was used to create a continuous tear capsulotomy. Hydrodissection was performed with balanced salt solution on a Fine canula. The lens nucleus was then removed using the phacoemulsification handpiece. Residual cortex was removed with the I&A handpiece. The anterior chamber and capsular bag were refilled with Provisc. A posterior chamber intraocular lens was placed into the capsular bag with it's injector. The implant was positioned with the Kuglan hook. The Provisc was then removed from the anterior chamber and capsular bag with the I&A handpiece. Stromal hydration of the main incision and paracentesis port was performed with BSS on a Fine canula. The wounds were tested for leak which was negative. The patient tolerated the procedure well. There were no operative complications. The patient was then transferred to the recovery room in stable condition.  Complications: None  Specimen: None  EBL: None  Prosthetic device: Hoya iSert 250, power 26.0 D, SN  [id number].

## 2013-11-28 NOTE — Anesthesia Preprocedure Evaluation (Signed)
Anesthesia Evaluation  Patient identified by MRN, date of birth, ID band Patient awake    Reviewed: Allergy & Precautions, H&P , NPO status , Patient's Chart, lab work & pertinent test results  Airway Mallampati: III TM Distance: >3 FB     Dental  (+) Edentulous Upper, Edentulous Lower   Pulmonary COPD COPD inhaler, Current Smoker,  breath sounds clear to auscultation        Cardiovascular negative cardio ROS  Rhythm:Regular Rate:Normal     Neuro/Psych PSYCHIATRIC DISORDERS Bipolar Disorder    GI/Hepatic negative GI ROS,   Endo/Other  Hypothyroidism   Renal/GU      Musculoskeletal   Abdominal   Peds  Hematology   Anesthesia Other Findings   Reproductive/Obstetrics                           Anesthesia Physical Anesthesia Plan  ASA: III  Anesthesia Plan: MAC   Post-op Pain Management:    Induction: Intravenous  Airway Management Planned: Nasal Cannula  Additional Equipment:   Intra-op Plan:   Post-operative Plan:   Informed Consent: I have reviewed the patients History and Physical, chart, labs and discussed the procedure including the risks, benefits and alternatives for the proposed anesthesia with the patient or authorized representative who has indicated his/her understanding and acceptance.     Plan Discussed with:   Anesthesia Plan Comments:         Anesthesia Quick Evaluation

## 2013-11-28 NOTE — Anesthesia Postprocedure Evaluation (Signed)
  Anesthesia Post-op Note  Patient: Kristen Sanchez  Procedure(s) Performed: Procedure(s) with comments: CATARACT EXTRACTION PHACO AND INTRAOCULAR LENS PLACEMENT (IOC) (Left) - CDE 8.65  Patient Location: Short Stay  Anesthesia Type:MAC  Level of Consciousness: awake  Airway and Oxygen Therapy: Patient Spontanous Breathing  Post-op Pain: none  Post-op Assessment: Post-op Vital signs reviewed, Patient's Cardiovascular Status Stable, Respiratory Function Stable, Patent Airway and No signs of Nausea or vomiting  Post-op Vital Signs: Reviewed and stable  Last Vitals:  Filed Vitals:   11/28/13 0740  Temp: 36.8 C    Complications: No apparent anesthesia complications

## 2013-11-30 ENCOUNTER — Encounter (HOSPITAL_COMMUNITY): Payer: Self-pay | Admitting: Ophthalmology

## 2015-04-23 DIAGNOSIS — Z9181 History of falling: Secondary | ICD-10-CM | POA: Diagnosis not present

## 2015-04-23 DIAGNOSIS — M1990 Unspecified osteoarthritis, unspecified site: Secondary | ICD-10-CM | POA: Diagnosis not present

## 2015-04-23 DIAGNOSIS — G629 Polyneuropathy, unspecified: Secondary | ICD-10-CM | POA: Diagnosis not present

## 2015-04-23 DIAGNOSIS — J449 Chronic obstructive pulmonary disease, unspecified: Secondary | ICD-10-CM | POA: Diagnosis not present

## 2015-04-23 DIAGNOSIS — M6281 Muscle weakness (generalized): Secondary | ICD-10-CM | POA: Diagnosis not present

## 2015-04-23 DIAGNOSIS — F329 Major depressive disorder, single episode, unspecified: Secondary | ICD-10-CM | POA: Diagnosis not present

## 2015-04-23 DIAGNOSIS — I2510 Atherosclerotic heart disease of native coronary artery without angina pectoris: Secondary | ICD-10-CM | POA: Diagnosis not present

## 2015-04-25 DIAGNOSIS — Z9181 History of falling: Secondary | ICD-10-CM | POA: Diagnosis not present

## 2015-04-25 DIAGNOSIS — G629 Polyneuropathy, unspecified: Secondary | ICD-10-CM | POA: Diagnosis not present

## 2015-04-25 DIAGNOSIS — M1990 Unspecified osteoarthritis, unspecified site: Secondary | ICD-10-CM | POA: Diagnosis not present

## 2015-04-25 DIAGNOSIS — I2510 Atherosclerotic heart disease of native coronary artery without angina pectoris: Secondary | ICD-10-CM | POA: Diagnosis not present

## 2015-04-25 DIAGNOSIS — M6281 Muscle weakness (generalized): Secondary | ICD-10-CM | POA: Diagnosis not present

## 2015-04-25 DIAGNOSIS — J449 Chronic obstructive pulmonary disease, unspecified: Secondary | ICD-10-CM | POA: Diagnosis not present

## 2015-04-30 DIAGNOSIS — I2510 Atherosclerotic heart disease of native coronary artery without angina pectoris: Secondary | ICD-10-CM | POA: Diagnosis not present

## 2015-04-30 DIAGNOSIS — G629 Polyneuropathy, unspecified: Secondary | ICD-10-CM | POA: Diagnosis not present

## 2015-04-30 DIAGNOSIS — Z9181 History of falling: Secondary | ICD-10-CM | POA: Diagnosis not present

## 2015-04-30 DIAGNOSIS — M1990 Unspecified osteoarthritis, unspecified site: Secondary | ICD-10-CM | POA: Diagnosis not present

## 2015-04-30 DIAGNOSIS — M6281 Muscle weakness (generalized): Secondary | ICD-10-CM | POA: Diagnosis not present

## 2015-04-30 DIAGNOSIS — J449 Chronic obstructive pulmonary disease, unspecified: Secondary | ICD-10-CM | POA: Diagnosis not present

## 2015-05-02 DIAGNOSIS — E039 Hypothyroidism, unspecified: Secondary | ICD-10-CM | POA: Diagnosis not present

## 2015-05-03 DIAGNOSIS — G629 Polyneuropathy, unspecified: Secondary | ICD-10-CM | POA: Diagnosis not present

## 2015-05-03 DIAGNOSIS — J449 Chronic obstructive pulmonary disease, unspecified: Secondary | ICD-10-CM | POA: Diagnosis not present

## 2015-05-03 DIAGNOSIS — M6281 Muscle weakness (generalized): Secondary | ICD-10-CM | POA: Diagnosis not present

## 2015-05-03 DIAGNOSIS — Z9181 History of falling: Secondary | ICD-10-CM | POA: Diagnosis not present

## 2015-05-03 DIAGNOSIS — I2510 Atherosclerotic heart disease of native coronary artery without angina pectoris: Secondary | ICD-10-CM | POA: Diagnosis not present

## 2015-05-03 DIAGNOSIS — M1990 Unspecified osteoarthritis, unspecified site: Secondary | ICD-10-CM | POA: Diagnosis not present

## 2015-05-08 DIAGNOSIS — G629 Polyneuropathy, unspecified: Secondary | ICD-10-CM | POA: Diagnosis not present

## 2015-05-08 DIAGNOSIS — M6281 Muscle weakness (generalized): Secondary | ICD-10-CM | POA: Diagnosis not present

## 2015-05-08 DIAGNOSIS — J449 Chronic obstructive pulmonary disease, unspecified: Secondary | ICD-10-CM | POA: Diagnosis not present

## 2015-05-08 DIAGNOSIS — I2510 Atherosclerotic heart disease of native coronary artery without angina pectoris: Secondary | ICD-10-CM | POA: Diagnosis not present

## 2015-05-08 DIAGNOSIS — M1990 Unspecified osteoarthritis, unspecified site: Secondary | ICD-10-CM | POA: Diagnosis not present

## 2015-05-08 DIAGNOSIS — Z9181 History of falling: Secondary | ICD-10-CM | POA: Diagnosis not present

## 2015-05-09 DIAGNOSIS — F3132 Bipolar disorder, current episode depressed, moderate: Secondary | ICD-10-CM | POA: Diagnosis not present

## 2015-05-09 DIAGNOSIS — R609 Edema, unspecified: Secondary | ICD-10-CM | POA: Diagnosis not present

## 2015-05-09 DIAGNOSIS — E782 Mixed hyperlipidemia: Secondary | ICD-10-CM | POA: Diagnosis not present

## 2015-05-09 DIAGNOSIS — R296 Repeated falls: Secondary | ICD-10-CM | POA: Diagnosis not present

## 2015-05-09 DIAGNOSIS — M6281 Muscle weakness (generalized): Secondary | ICD-10-CM | POA: Diagnosis not present

## 2015-05-09 DIAGNOSIS — J441 Chronic obstructive pulmonary disease with (acute) exacerbation: Secondary | ICD-10-CM | POA: Diagnosis not present

## 2015-05-09 DIAGNOSIS — E039 Hypothyroidism, unspecified: Secondary | ICD-10-CM | POA: Diagnosis not present

## 2015-05-09 DIAGNOSIS — M25561 Pain in right knee: Secondary | ICD-10-CM | POA: Diagnosis not present

## 2015-05-09 DIAGNOSIS — Z720 Tobacco use: Secondary | ICD-10-CM | POA: Diagnosis not present

## 2015-05-09 DIAGNOSIS — F419 Anxiety disorder, unspecified: Secondary | ICD-10-CM | POA: Diagnosis not present

## 2015-05-09 DIAGNOSIS — G629 Polyneuropathy, unspecified: Secondary | ICD-10-CM | POA: Diagnosis not present

## 2015-05-09 DIAGNOSIS — I2510 Atherosclerotic heart disease of native coronary artery without angina pectoris: Secondary | ICD-10-CM | POA: Diagnosis not present

## 2015-05-10 DIAGNOSIS — J449 Chronic obstructive pulmonary disease, unspecified: Secondary | ICD-10-CM | POA: Diagnosis not present

## 2015-05-10 DIAGNOSIS — M1990 Unspecified osteoarthritis, unspecified site: Secondary | ICD-10-CM | POA: Diagnosis not present

## 2015-05-10 DIAGNOSIS — I2510 Atherosclerotic heart disease of native coronary artery without angina pectoris: Secondary | ICD-10-CM | POA: Diagnosis not present

## 2015-05-10 DIAGNOSIS — M6281 Muscle weakness (generalized): Secondary | ICD-10-CM | POA: Diagnosis not present

## 2015-05-10 DIAGNOSIS — G629 Polyneuropathy, unspecified: Secondary | ICD-10-CM | POA: Diagnosis not present

## 2015-05-10 DIAGNOSIS — Z9181 History of falling: Secondary | ICD-10-CM | POA: Diagnosis not present

## 2015-05-14 DIAGNOSIS — M6281 Muscle weakness (generalized): Secondary | ICD-10-CM | POA: Diagnosis not present

## 2015-05-14 DIAGNOSIS — J449 Chronic obstructive pulmonary disease, unspecified: Secondary | ICD-10-CM | POA: Diagnosis not present

## 2015-05-14 DIAGNOSIS — M25561 Pain in right knee: Secondary | ICD-10-CM | POA: Diagnosis not present

## 2015-05-14 DIAGNOSIS — I2510 Atherosclerotic heart disease of native coronary artery without angina pectoris: Secondary | ICD-10-CM | POA: Diagnosis not present

## 2015-05-14 DIAGNOSIS — G629 Polyneuropathy, unspecified: Secondary | ICD-10-CM | POA: Diagnosis not present

## 2015-05-14 DIAGNOSIS — M1711 Unilateral primary osteoarthritis, right knee: Secondary | ICD-10-CM | POA: Diagnosis not present

## 2015-05-14 DIAGNOSIS — M1990 Unspecified osteoarthritis, unspecified site: Secondary | ICD-10-CM | POA: Diagnosis not present

## 2015-05-14 DIAGNOSIS — Z9181 History of falling: Secondary | ICD-10-CM | POA: Diagnosis not present

## 2015-05-16 DIAGNOSIS — M1990 Unspecified osteoarthritis, unspecified site: Secondary | ICD-10-CM | POA: Diagnosis not present

## 2015-05-16 DIAGNOSIS — I2510 Atherosclerotic heart disease of native coronary artery without angina pectoris: Secondary | ICD-10-CM | POA: Diagnosis not present

## 2015-05-16 DIAGNOSIS — J449 Chronic obstructive pulmonary disease, unspecified: Secondary | ICD-10-CM | POA: Diagnosis not present

## 2015-05-16 DIAGNOSIS — G629 Polyneuropathy, unspecified: Secondary | ICD-10-CM | POA: Diagnosis not present

## 2015-05-16 DIAGNOSIS — Z9181 History of falling: Secondary | ICD-10-CM | POA: Diagnosis not present

## 2015-05-16 DIAGNOSIS — M6281 Muscle weakness (generalized): Secondary | ICD-10-CM | POA: Diagnosis not present

## 2015-05-21 DIAGNOSIS — J441 Chronic obstructive pulmonary disease with (acute) exacerbation: Secondary | ICD-10-CM | POA: Diagnosis not present

## 2015-06-12 DIAGNOSIS — L299 Pruritus, unspecified: Secondary | ICD-10-CM | POA: Diagnosis not present

## 2015-06-12 DIAGNOSIS — M792 Neuralgia and neuritis, unspecified: Secondary | ICD-10-CM | POA: Diagnosis not present

## 2015-06-12 DIAGNOSIS — F419 Anxiety disorder, unspecified: Secondary | ICD-10-CM | POA: Diagnosis not present

## 2015-06-12 DIAGNOSIS — Z9981 Dependence on supplemental oxygen: Secondary | ICD-10-CM | POA: Diagnosis not present

## 2015-06-12 DIAGNOSIS — G4700 Insomnia, unspecified: Secondary | ICD-10-CM | POA: Diagnosis not present

## 2015-06-12 DIAGNOSIS — S199XXA Unspecified injury of neck, initial encounter: Secondary | ICD-10-CM | POA: Diagnosis not present

## 2015-06-12 DIAGNOSIS — J9612 Chronic respiratory failure with hypercapnia: Secondary | ICD-10-CM | POA: Diagnosis not present

## 2015-06-12 DIAGNOSIS — F17200 Nicotine dependence, unspecified, uncomplicated: Secondary | ICD-10-CM | POA: Diagnosis not present

## 2015-06-12 DIAGNOSIS — M1990 Unspecified osteoarthritis, unspecified site: Secondary | ICD-10-CM | POA: Diagnosis not present

## 2015-06-12 DIAGNOSIS — J449 Chronic obstructive pulmonary disease, unspecified: Secondary | ICD-10-CM | POA: Diagnosis not present

## 2015-06-12 DIAGNOSIS — J441 Chronic obstructive pulmonary disease with (acute) exacerbation: Secondary | ICD-10-CM | POA: Diagnosis not present

## 2015-06-12 DIAGNOSIS — R531 Weakness: Secondary | ICD-10-CM | POA: Diagnosis present

## 2015-06-12 DIAGNOSIS — Z9181 History of falling: Secondary | ICD-10-CM | POA: Diagnosis not present

## 2015-06-12 DIAGNOSIS — R2689 Other abnormalities of gait and mobility: Secondary | ICD-10-CM | POA: Diagnosis present

## 2015-06-12 DIAGNOSIS — R296 Repeated falls: Secondary | ICD-10-CM | POA: Diagnosis not present

## 2015-06-12 DIAGNOSIS — M6281 Muscle weakness (generalized): Secondary | ICD-10-CM | POA: Diagnosis not present

## 2015-06-12 DIAGNOSIS — G40909 Epilepsy, unspecified, not intractable, without status epilepticus: Secondary | ICD-10-CM | POA: Diagnosis not present

## 2015-06-12 DIAGNOSIS — R262 Difficulty in walking, not elsewhere classified: Secondary | ICD-10-CM | POA: Diagnosis not present

## 2015-06-12 DIAGNOSIS — J9690 Respiratory failure, unspecified, unspecified whether with hypoxia or hypercapnia: Secondary | ICD-10-CM | POA: Diagnosis not present

## 2015-06-12 DIAGNOSIS — J9692 Respiratory failure, unspecified with hypercapnia: Secondary | ICD-10-CM | POA: Diagnosis not present

## 2015-06-12 DIAGNOSIS — K5909 Other constipation: Secondary | ICD-10-CM | POA: Diagnosis not present

## 2015-06-12 DIAGNOSIS — S0990XA Unspecified injury of head, initial encounter: Secondary | ICD-10-CM | POA: Diagnosis not present

## 2015-06-12 DIAGNOSIS — E039 Hypothyroidism, unspecified: Secondary | ICD-10-CM | POA: Diagnosis not present

## 2015-06-12 DIAGNOSIS — E785 Hyperlipidemia, unspecified: Secondary | ICD-10-CM | POA: Diagnosis not present

## 2015-06-12 DIAGNOSIS — F329 Major depressive disorder, single episode, unspecified: Secondary | ICD-10-CM | POA: Diagnosis not present

## 2015-06-12 DIAGNOSIS — W0110XA Fall on same level from slipping, tripping and stumbling with subsequent striking against unspecified object, initial encounter: Secondary | ICD-10-CM | POA: Diagnosis not present

## 2015-06-12 DIAGNOSIS — M25511 Pain in right shoulder: Secondary | ICD-10-CM | POA: Diagnosis not present

## 2015-06-12 DIAGNOSIS — F319 Bipolar disorder, unspecified: Secondary | ICD-10-CM | POA: Diagnosis not present

## 2015-06-12 DIAGNOSIS — J9602 Acute respiratory failure with hypercapnia: Secondary | ICD-10-CM | POA: Diagnosis present

## 2015-06-12 DIAGNOSIS — S40011A Contusion of right shoulder, initial encounter: Secondary | ICD-10-CM | POA: Diagnosis not present

## 2015-06-12 DIAGNOSIS — S0003XA Contusion of scalp, initial encounter: Secondary | ICD-10-CM | POA: Diagnosis not present

## 2015-06-12 DIAGNOSIS — Z79899 Other long term (current) drug therapy: Secondary | ICD-10-CM | POA: Diagnosis not present

## 2015-06-18 DIAGNOSIS — G250 Essential tremor: Secondary | ICD-10-CM | POA: Diagnosis not present

## 2015-06-18 DIAGNOSIS — J9602 Acute respiratory failure with hypercapnia: Secondary | ICD-10-CM | POA: Diagnosis not present

## 2015-06-18 DIAGNOSIS — J440 Chronic obstructive pulmonary disease with acute lower respiratory infection: Secondary | ICD-10-CM | POA: Diagnosis not present

## 2015-06-18 DIAGNOSIS — F419 Anxiety disorder, unspecified: Secondary | ICD-10-CM | POA: Diagnosis not present

## 2015-06-18 DIAGNOSIS — R262 Difficulty in walking, not elsewhere classified: Secondary | ICD-10-CM | POA: Diagnosis not present

## 2015-06-18 DIAGNOSIS — M6281 Muscle weakness (generalized): Secondary | ICD-10-CM | POA: Diagnosis not present

## 2015-06-18 DIAGNOSIS — G40909 Epilepsy, unspecified, not intractable, without status epilepticus: Secondary | ICD-10-CM | POA: Diagnosis not present

## 2015-06-18 DIAGNOSIS — Z9981 Dependence on supplemental oxygen: Secondary | ICD-10-CM | POA: Diagnosis not present

## 2015-06-18 DIAGNOSIS — Z9181 History of falling: Secondary | ICD-10-CM | POA: Diagnosis not present

## 2015-06-18 DIAGNOSIS — G4700 Insomnia, unspecified: Secondary | ICD-10-CM | POA: Diagnosis not present

## 2015-06-18 DIAGNOSIS — I872 Venous insufficiency (chronic) (peripheral): Secondary | ICD-10-CM | POA: Diagnosis not present

## 2015-06-18 DIAGNOSIS — F3132 Bipolar disorder, current episode depressed, moderate: Secondary | ICD-10-CM | POA: Diagnosis not present

## 2015-06-18 DIAGNOSIS — M792 Neuralgia and neuritis, unspecified: Secondary | ICD-10-CM | POA: Diagnosis not present

## 2015-06-18 DIAGNOSIS — J441 Chronic obstructive pulmonary disease with (acute) exacerbation: Secondary | ICD-10-CM | POA: Diagnosis not present

## 2015-06-18 DIAGNOSIS — Z720 Tobacco use: Secondary | ICD-10-CM | POA: Diagnosis not present

## 2015-06-18 DIAGNOSIS — J449 Chronic obstructive pulmonary disease, unspecified: Secondary | ICD-10-CM | POA: Diagnosis not present

## 2015-06-18 DIAGNOSIS — E782 Mixed hyperlipidemia: Secondary | ICD-10-CM | POA: Diagnosis not present

## 2015-06-18 DIAGNOSIS — E785 Hyperlipidemia, unspecified: Secondary | ICD-10-CM | POA: Diagnosis not present

## 2015-06-18 DIAGNOSIS — R609 Edema, unspecified: Secondary | ICD-10-CM | POA: Diagnosis not present

## 2015-06-18 DIAGNOSIS — F319 Bipolar disorder, unspecified: Secondary | ICD-10-CM | POA: Diagnosis not present

## 2015-06-18 DIAGNOSIS — E039 Hypothyroidism, unspecified: Secondary | ICD-10-CM | POA: Diagnosis not present

## 2015-06-18 DIAGNOSIS — F329 Major depressive disorder, single episode, unspecified: Secondary | ICD-10-CM | POA: Diagnosis not present

## 2015-06-18 DIAGNOSIS — M1990 Unspecified osteoarthritis, unspecified site: Secondary | ICD-10-CM | POA: Diagnosis not present

## 2015-06-18 DIAGNOSIS — L299 Pruritus, unspecified: Secondary | ICD-10-CM | POA: Diagnosis not present

## 2015-06-18 DIAGNOSIS — K5909 Other constipation: Secondary | ICD-10-CM | POA: Diagnosis not present

## 2015-06-18 DIAGNOSIS — R296 Repeated falls: Secondary | ICD-10-CM | POA: Diagnosis not present

## 2015-06-19 DIAGNOSIS — J440 Chronic obstructive pulmonary disease with acute lower respiratory infection: Secondary | ICD-10-CM | POA: Diagnosis not present

## 2015-07-07 DIAGNOSIS — J440 Chronic obstructive pulmonary disease with acute lower respiratory infection: Secondary | ICD-10-CM | POA: Diagnosis not present

## 2015-08-30 DIAGNOSIS — I872 Venous insufficiency (chronic) (peripheral): Secondary | ICD-10-CM | POA: Diagnosis not present

## 2015-08-30 DIAGNOSIS — E039 Hypothyroidism, unspecified: Secondary | ICD-10-CM | POA: Diagnosis not present

## 2015-08-30 DIAGNOSIS — R609 Edema, unspecified: Secondary | ICD-10-CM | POA: Diagnosis not present

## 2015-08-30 DIAGNOSIS — J441 Chronic obstructive pulmonary disease with (acute) exacerbation: Secondary | ICD-10-CM | POA: Diagnosis not present

## 2015-08-30 DIAGNOSIS — F3132 Bipolar disorder, current episode depressed, moderate: Secondary | ICD-10-CM | POA: Diagnosis not present

## 2015-08-30 DIAGNOSIS — F419 Anxiety disorder, unspecified: Secondary | ICD-10-CM | POA: Diagnosis not present

## 2015-08-30 DIAGNOSIS — E782 Mixed hyperlipidemia: Secondary | ICD-10-CM | POA: Diagnosis not present

## 2015-08-30 DIAGNOSIS — Z720 Tobacco use: Secondary | ICD-10-CM | POA: Diagnosis not present

## 2015-09-04 DIAGNOSIS — J440 Chronic obstructive pulmonary disease with acute lower respiratory infection: Secondary | ICD-10-CM | POA: Diagnosis not present

## 2015-09-11 DIAGNOSIS — G250 Essential tremor: Secondary | ICD-10-CM | POA: Diagnosis not present

## 2015-09-26 IMAGING — CR DG CHEST 1V PORT
1 series · 1 of 1 positions shown · non-contrast
Comparison: Prior chest x-ray 01/13/2013

CLINICAL DATA: Altered mental status, hallucinations

PORTABLE CHEST - 1 VIEW

[portable]
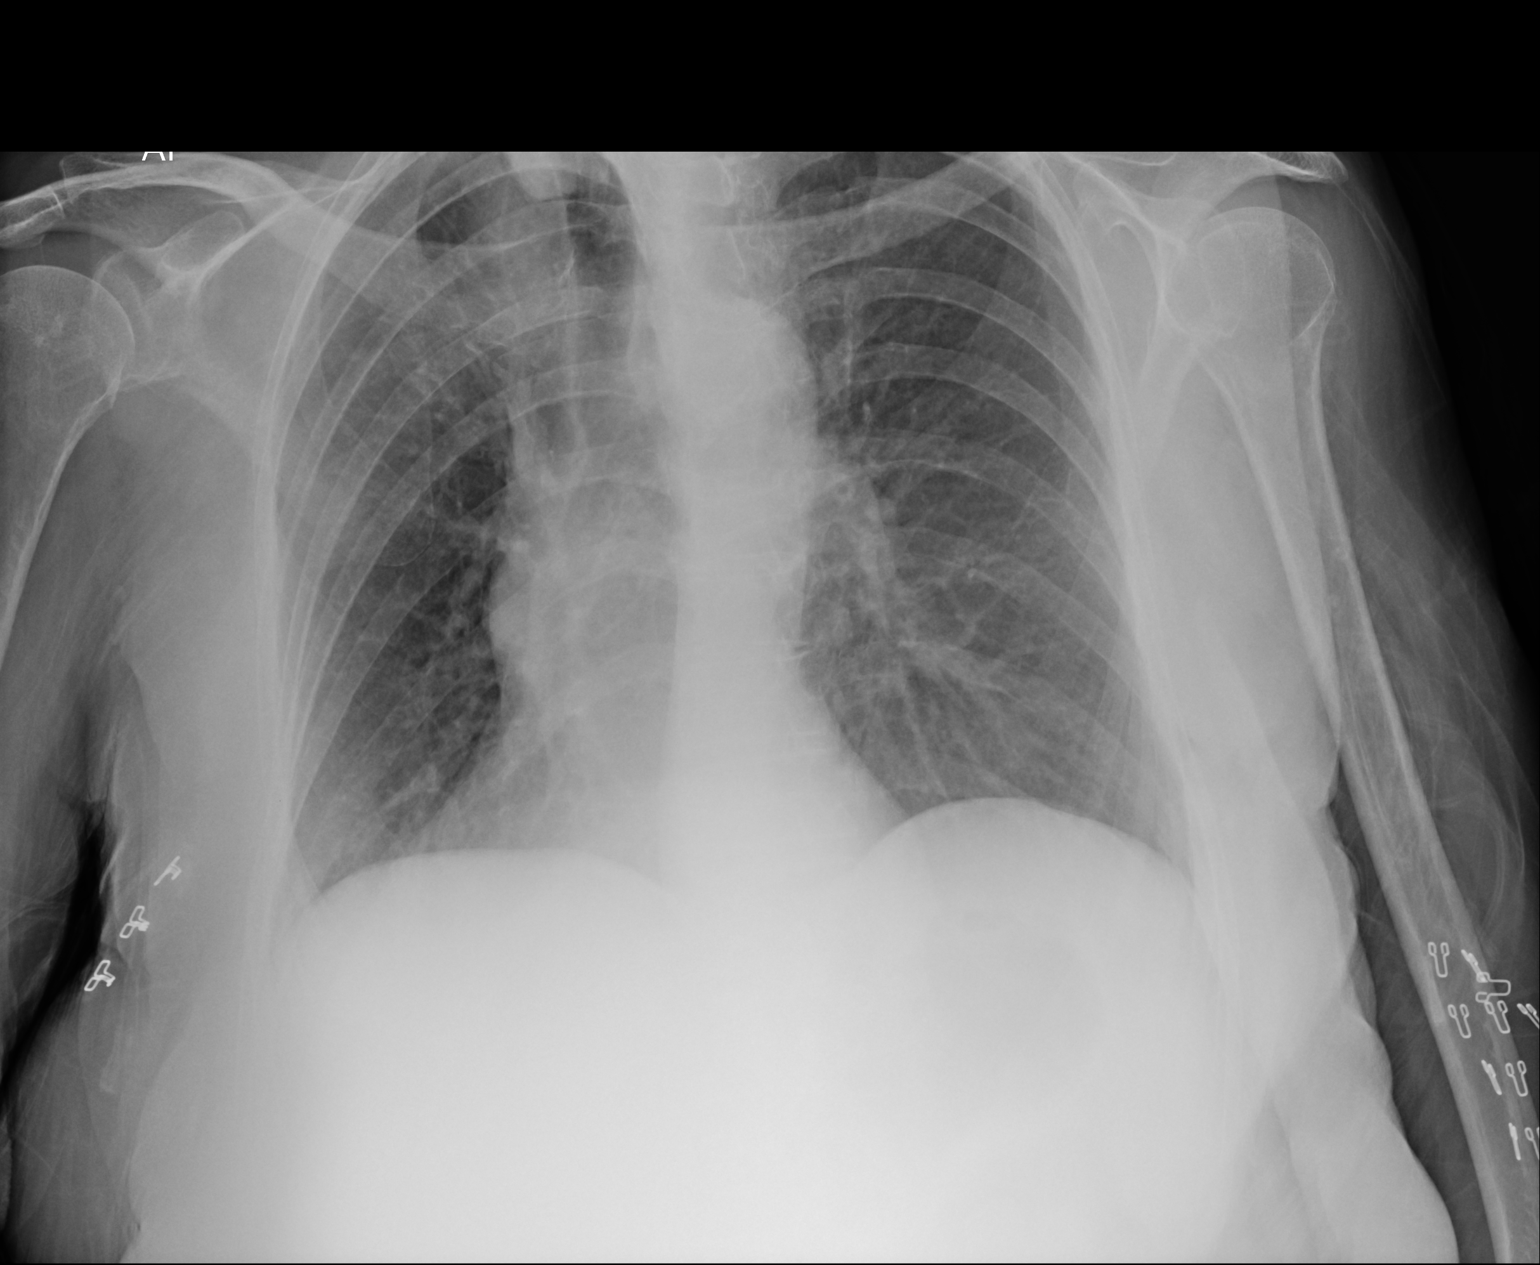

[1 of 1 positions shown; findings below may reference images not displayed]

FINDINGS: The patient is rotated toward the right.  Interval
clearing of the right middle lobe compared to the prior chest x-
ray.  Lung volumes are low.  Chronic central bronchitic changes and
prominence of the interstitial markings appears similar compared to
prior.  Stable cardiac and mediastinal contours.  Atherosclerotic
calcifications noted in the transverse aorta.  No acute osseous
abnormality.
IMPRESSION: 1.  No acute cardiopulmonary process.
2.  Interval clearing of right middle lobe infiltrate compared to
prior.
3.  Stable chronic lung changes.

## 2015-09-27 DIAGNOSIS — S51802A Unspecified open wound of left forearm, initial encounter: Secondary | ICD-10-CM | POA: Diagnosis not present

## 2015-09-27 DIAGNOSIS — Z9981 Dependence on supplemental oxygen: Secondary | ICD-10-CM | POA: Diagnosis not present

## 2015-09-27 DIAGNOSIS — Z7951 Long term (current) use of inhaled steroids: Secondary | ICD-10-CM | POA: Diagnosis not present

## 2015-09-27 DIAGNOSIS — L97819 Non-pressure chronic ulcer of other part of right lower leg with unspecified severity: Secondary | ICD-10-CM | POA: Diagnosis not present

## 2015-09-27 DIAGNOSIS — E7800 Pure hypercholesterolemia, unspecified: Secondary | ICD-10-CM | POA: Diagnosis not present

## 2015-09-27 DIAGNOSIS — S51812A Laceration without foreign body of left forearm, initial encounter: Secondary | ICD-10-CM | POA: Diagnosis not present

## 2015-09-27 DIAGNOSIS — F419 Anxiety disorder, unspecified: Secondary | ICD-10-CM | POA: Diagnosis not present

## 2015-09-27 DIAGNOSIS — I10 Essential (primary) hypertension: Secondary | ICD-10-CM | POA: Diagnosis not present

## 2015-09-27 DIAGNOSIS — J449 Chronic obstructive pulmonary disease, unspecified: Secondary | ICD-10-CM | POA: Diagnosis not present

## 2015-09-27 DIAGNOSIS — E039 Hypothyroidism, unspecified: Secondary | ICD-10-CM | POA: Diagnosis not present

## 2015-09-27 DIAGNOSIS — I83028 Varicose veins of left lower extremity with ulcer other part of lower leg: Secondary | ICD-10-CM | POA: Diagnosis not present

## 2015-09-27 DIAGNOSIS — Z79899 Other long term (current) drug therapy: Secondary | ICD-10-CM | POA: Diagnosis not present

## 2015-09-27 DIAGNOSIS — F319 Bipolar disorder, unspecified: Secondary | ICD-10-CM | POA: Diagnosis not present

## 2015-09-27 DIAGNOSIS — L97829 Non-pressure chronic ulcer of other part of left lower leg with unspecified severity: Secondary | ICD-10-CM | POA: Diagnosis not present

## 2015-09-27 DIAGNOSIS — I872 Venous insufficiency (chronic) (peripheral): Secondary | ICD-10-CM | POA: Diagnosis not present

## 2015-09-27 DIAGNOSIS — L97821 Non-pressure chronic ulcer of other part of left lower leg limited to breakdown of skin: Secondary | ICD-10-CM | POA: Diagnosis not present

## 2015-10-03 DIAGNOSIS — L97819 Non-pressure chronic ulcer of other part of right lower leg with unspecified severity: Secondary | ICD-10-CM | POA: Diagnosis not present

## 2015-10-03 DIAGNOSIS — F419 Anxiety disorder, unspecified: Secondary | ICD-10-CM | POA: Diagnosis not present

## 2015-10-03 DIAGNOSIS — J449 Chronic obstructive pulmonary disease, unspecified: Secondary | ICD-10-CM | POA: Diagnosis not present

## 2015-10-03 DIAGNOSIS — L97829 Non-pressure chronic ulcer of other part of left lower leg with unspecified severity: Secondary | ICD-10-CM | POA: Diagnosis not present

## 2015-10-03 DIAGNOSIS — S51812D Laceration without foreign body of left forearm, subsequent encounter: Secondary | ICD-10-CM | POA: Diagnosis not present

## 2015-10-03 DIAGNOSIS — S51802A Unspecified open wound of left forearm, initial encounter: Secondary | ICD-10-CM | POA: Diagnosis not present

## 2015-10-03 DIAGNOSIS — I872 Venous insufficiency (chronic) (peripheral): Secondary | ICD-10-CM | POA: Diagnosis not present

## 2015-10-04 DIAGNOSIS — Z720 Tobacco use: Secondary | ICD-10-CM | POA: Diagnosis not present

## 2015-10-04 DIAGNOSIS — M25561 Pain in right knee: Secondary | ICD-10-CM | POA: Diagnosis not present

## 2015-10-04 DIAGNOSIS — F3132 Bipolar disorder, current episode depressed, moderate: Secondary | ICD-10-CM | POA: Diagnosis not present

## 2015-10-04 DIAGNOSIS — R296 Repeated falls: Secondary | ICD-10-CM | POA: Diagnosis not present

## 2015-10-04 DIAGNOSIS — E782 Mixed hyperlipidemia: Secondary | ICD-10-CM | POA: Diagnosis not present

## 2015-10-04 DIAGNOSIS — I872 Venous insufficiency (chronic) (peripheral): Secondary | ICD-10-CM | POA: Diagnosis not present

## 2015-10-04 DIAGNOSIS — E039 Hypothyroidism, unspecified: Secondary | ICD-10-CM | POA: Diagnosis not present

## 2015-10-04 DIAGNOSIS — D692 Other nonthrombocytopenic purpura: Secondary | ICD-10-CM | POA: Diagnosis not present

## 2015-10-04 DIAGNOSIS — F419 Anxiety disorder, unspecified: Secondary | ICD-10-CM | POA: Diagnosis not present

## 2015-10-04 DIAGNOSIS — J441 Chronic obstructive pulmonary disease with (acute) exacerbation: Secondary | ICD-10-CM | POA: Diagnosis not present

## 2015-10-04 DIAGNOSIS — R32 Unspecified urinary incontinence: Secondary | ICD-10-CM | POA: Diagnosis not present

## 2015-10-04 DIAGNOSIS — R609 Edema, unspecified: Secondary | ICD-10-CM | POA: Diagnosis not present

## 2015-10-05 DIAGNOSIS — J441 Chronic obstructive pulmonary disease with (acute) exacerbation: Secondary | ICD-10-CM | POA: Diagnosis not present

## 2015-10-05 DIAGNOSIS — F419 Anxiety disorder, unspecified: Secondary | ICD-10-CM | POA: Diagnosis not present

## 2015-10-05 DIAGNOSIS — J449 Chronic obstructive pulmonary disease, unspecified: Secondary | ICD-10-CM | POA: Diagnosis not present

## 2015-10-05 DIAGNOSIS — M25561 Pain in right knee: Secondary | ICD-10-CM | POA: Diagnosis not present

## 2015-10-09 DIAGNOSIS — J441 Chronic obstructive pulmonary disease with (acute) exacerbation: Secondary | ICD-10-CM | POA: Diagnosis not present

## 2015-10-09 DIAGNOSIS — E039 Hypothyroidism, unspecified: Secondary | ICD-10-CM | POA: Diagnosis not present

## 2015-10-09 DIAGNOSIS — Z9181 History of falling: Secondary | ICD-10-CM | POA: Diagnosis not present

## 2015-10-09 DIAGNOSIS — M25561 Pain in right knee: Secondary | ICD-10-CM | POA: Diagnosis not present

## 2015-10-09 DIAGNOSIS — E782 Mixed hyperlipidemia: Secondary | ICD-10-CM | POA: Diagnosis not present

## 2015-10-09 DIAGNOSIS — F419 Anxiety disorder, unspecified: Secondary | ICD-10-CM | POA: Diagnosis not present

## 2015-10-09 DIAGNOSIS — Z9981 Dependence on supplemental oxygen: Secondary | ICD-10-CM | POA: Diagnosis not present

## 2015-10-09 DIAGNOSIS — J449 Chronic obstructive pulmonary disease, unspecified: Secondary | ICD-10-CM | POA: Diagnosis not present

## 2015-10-10 DIAGNOSIS — S51802A Unspecified open wound of left forearm, initial encounter: Secondary | ICD-10-CM | POA: Diagnosis not present

## 2015-10-10 DIAGNOSIS — E782 Mixed hyperlipidemia: Secondary | ICD-10-CM | POA: Diagnosis not present

## 2015-10-10 DIAGNOSIS — L97819 Non-pressure chronic ulcer of other part of right lower leg with unspecified severity: Secondary | ICD-10-CM | POA: Diagnosis not present

## 2015-10-10 DIAGNOSIS — J449 Chronic obstructive pulmonary disease, unspecified: Secondary | ICD-10-CM | POA: Diagnosis not present

## 2015-10-10 DIAGNOSIS — M25561 Pain in right knee: Secondary | ICD-10-CM | POA: Diagnosis not present

## 2015-10-10 DIAGNOSIS — S51812D Laceration without foreign body of left forearm, subsequent encounter: Secondary | ICD-10-CM | POA: Diagnosis not present

## 2015-10-10 DIAGNOSIS — L97829 Non-pressure chronic ulcer of other part of left lower leg with unspecified severity: Secondary | ICD-10-CM | POA: Diagnosis not present

## 2015-10-10 DIAGNOSIS — F419 Anxiety disorder, unspecified: Secondary | ICD-10-CM | POA: Diagnosis not present

## 2015-10-10 DIAGNOSIS — J441 Chronic obstructive pulmonary disease with (acute) exacerbation: Secondary | ICD-10-CM | POA: Diagnosis not present

## 2015-10-10 DIAGNOSIS — E039 Hypothyroidism, unspecified: Secondary | ICD-10-CM | POA: Diagnosis not present

## 2015-10-10 DIAGNOSIS — I872 Venous insufficiency (chronic) (peripheral): Secondary | ICD-10-CM | POA: Diagnosis not present

## 2015-10-11 DIAGNOSIS — M25561 Pain in right knee: Secondary | ICD-10-CM | POA: Diagnosis not present

## 2015-10-11 DIAGNOSIS — J449 Chronic obstructive pulmonary disease, unspecified: Secondary | ICD-10-CM | POA: Diagnosis not present

## 2015-10-11 DIAGNOSIS — J441 Chronic obstructive pulmonary disease with (acute) exacerbation: Secondary | ICD-10-CM | POA: Diagnosis not present

## 2015-10-11 DIAGNOSIS — E782 Mixed hyperlipidemia: Secondary | ICD-10-CM | POA: Diagnosis not present

## 2015-10-11 DIAGNOSIS — F419 Anxiety disorder, unspecified: Secondary | ICD-10-CM | POA: Diagnosis not present

## 2015-10-11 DIAGNOSIS — E039 Hypothyroidism, unspecified: Secondary | ICD-10-CM | POA: Diagnosis not present

## 2015-10-12 DIAGNOSIS — M25561 Pain in right knee: Secondary | ICD-10-CM | POA: Diagnosis not present

## 2015-10-12 DIAGNOSIS — J441 Chronic obstructive pulmonary disease with (acute) exacerbation: Secondary | ICD-10-CM | POA: Diagnosis not present

## 2015-10-12 DIAGNOSIS — E039 Hypothyroidism, unspecified: Secondary | ICD-10-CM | POA: Diagnosis not present

## 2015-10-12 DIAGNOSIS — J449 Chronic obstructive pulmonary disease, unspecified: Secondary | ICD-10-CM | POA: Diagnosis not present

## 2015-10-12 DIAGNOSIS — J439 Emphysema, unspecified: Secondary | ICD-10-CM | POA: Diagnosis not present

## 2015-10-12 DIAGNOSIS — F419 Anxiety disorder, unspecified: Secondary | ICD-10-CM | POA: Diagnosis not present

## 2015-10-12 DIAGNOSIS — M1711 Unilateral primary osteoarthritis, right knee: Secondary | ICD-10-CM | POA: Diagnosis not present

## 2015-10-12 DIAGNOSIS — E782 Mixed hyperlipidemia: Secondary | ICD-10-CM | POA: Diagnosis not present

## 2015-10-15 DIAGNOSIS — E782 Mixed hyperlipidemia: Secondary | ICD-10-CM | POA: Diagnosis not present

## 2015-10-15 DIAGNOSIS — F419 Anxiety disorder, unspecified: Secondary | ICD-10-CM | POA: Diagnosis not present

## 2015-10-15 DIAGNOSIS — M25561 Pain in right knee: Secondary | ICD-10-CM | POA: Diagnosis not present

## 2015-10-15 DIAGNOSIS — E039 Hypothyroidism, unspecified: Secondary | ICD-10-CM | POA: Diagnosis not present

## 2015-10-15 DIAGNOSIS — J441 Chronic obstructive pulmonary disease with (acute) exacerbation: Secondary | ICD-10-CM | POA: Diagnosis not present

## 2015-10-15 DIAGNOSIS — J449 Chronic obstructive pulmonary disease, unspecified: Secondary | ICD-10-CM | POA: Diagnosis not present

## 2015-10-16 DIAGNOSIS — F419 Anxiety disorder, unspecified: Secondary | ICD-10-CM | POA: Diagnosis not present

## 2015-10-16 DIAGNOSIS — M25561 Pain in right knee: Secondary | ICD-10-CM | POA: Diagnosis not present

## 2015-10-16 DIAGNOSIS — J449 Chronic obstructive pulmonary disease, unspecified: Secondary | ICD-10-CM | POA: Diagnosis not present

## 2015-10-16 DIAGNOSIS — J441 Chronic obstructive pulmonary disease with (acute) exacerbation: Secondary | ICD-10-CM | POA: Diagnosis not present

## 2015-10-16 DIAGNOSIS — E039 Hypothyroidism, unspecified: Secondary | ICD-10-CM | POA: Diagnosis not present

## 2015-10-16 DIAGNOSIS — E782 Mixed hyperlipidemia: Secondary | ICD-10-CM | POA: Diagnosis not present

## 2015-10-17 DIAGNOSIS — E039 Hypothyroidism, unspecified: Secondary | ICD-10-CM | POA: Diagnosis not present

## 2015-10-17 DIAGNOSIS — M25561 Pain in right knee: Secondary | ICD-10-CM | POA: Diagnosis not present

## 2015-10-17 DIAGNOSIS — J449 Chronic obstructive pulmonary disease, unspecified: Secondary | ICD-10-CM | POA: Diagnosis not present

## 2015-10-17 DIAGNOSIS — E782 Mixed hyperlipidemia: Secondary | ICD-10-CM | POA: Diagnosis not present

## 2015-10-17 DIAGNOSIS — J441 Chronic obstructive pulmonary disease with (acute) exacerbation: Secondary | ICD-10-CM | POA: Diagnosis not present

## 2015-10-17 DIAGNOSIS — F419 Anxiety disorder, unspecified: Secondary | ICD-10-CM | POA: Diagnosis not present

## 2015-10-18 DIAGNOSIS — E782 Mixed hyperlipidemia: Secondary | ICD-10-CM | POA: Diagnosis not present

## 2015-10-18 DIAGNOSIS — M25561 Pain in right knee: Secondary | ICD-10-CM | POA: Diagnosis not present

## 2015-10-18 DIAGNOSIS — F419 Anxiety disorder, unspecified: Secondary | ICD-10-CM | POA: Diagnosis not present

## 2015-10-18 DIAGNOSIS — J449 Chronic obstructive pulmonary disease, unspecified: Secondary | ICD-10-CM | POA: Diagnosis not present

## 2015-10-18 DIAGNOSIS — J441 Chronic obstructive pulmonary disease with (acute) exacerbation: Secondary | ICD-10-CM | POA: Diagnosis not present

## 2015-10-18 DIAGNOSIS — E039 Hypothyroidism, unspecified: Secondary | ICD-10-CM | POA: Diagnosis not present

## 2015-10-19 DIAGNOSIS — M25561 Pain in right knee: Secondary | ICD-10-CM | POA: Diagnosis not present

## 2015-10-19 DIAGNOSIS — J449 Chronic obstructive pulmonary disease, unspecified: Secondary | ICD-10-CM | POA: Diagnosis not present

## 2015-10-19 DIAGNOSIS — E039 Hypothyroidism, unspecified: Secondary | ICD-10-CM | POA: Diagnosis not present

## 2015-10-19 DIAGNOSIS — F419 Anxiety disorder, unspecified: Secondary | ICD-10-CM | POA: Diagnosis not present

## 2015-10-19 DIAGNOSIS — E782 Mixed hyperlipidemia: Secondary | ICD-10-CM | POA: Diagnosis not present

## 2015-10-19 DIAGNOSIS — J441 Chronic obstructive pulmonary disease with (acute) exacerbation: Secondary | ICD-10-CM | POA: Diagnosis not present

## 2015-10-22 DIAGNOSIS — E039 Hypothyroidism, unspecified: Secondary | ICD-10-CM | POA: Diagnosis not present

## 2015-10-22 DIAGNOSIS — J449 Chronic obstructive pulmonary disease, unspecified: Secondary | ICD-10-CM | POA: Diagnosis not present

## 2015-10-22 DIAGNOSIS — J441 Chronic obstructive pulmonary disease with (acute) exacerbation: Secondary | ICD-10-CM | POA: Diagnosis not present

## 2015-10-22 DIAGNOSIS — F419 Anxiety disorder, unspecified: Secondary | ICD-10-CM | POA: Diagnosis not present

## 2015-10-22 DIAGNOSIS — E782 Mixed hyperlipidemia: Secondary | ICD-10-CM | POA: Diagnosis not present

## 2015-10-22 DIAGNOSIS — M25561 Pain in right knee: Secondary | ICD-10-CM | POA: Diagnosis not present

## 2015-10-23 DIAGNOSIS — F419 Anxiety disorder, unspecified: Secondary | ICD-10-CM | POA: Diagnosis not present

## 2015-10-23 DIAGNOSIS — E039 Hypothyroidism, unspecified: Secondary | ICD-10-CM | POA: Diagnosis not present

## 2015-10-23 DIAGNOSIS — J441 Chronic obstructive pulmonary disease with (acute) exacerbation: Secondary | ICD-10-CM | POA: Diagnosis not present

## 2015-10-23 DIAGNOSIS — M25561 Pain in right knee: Secondary | ICD-10-CM | POA: Diagnosis not present

## 2015-10-23 DIAGNOSIS — J449 Chronic obstructive pulmonary disease, unspecified: Secondary | ICD-10-CM | POA: Diagnosis not present

## 2015-10-23 DIAGNOSIS — E782 Mixed hyperlipidemia: Secondary | ICD-10-CM | POA: Diagnosis not present

## 2015-10-24 DIAGNOSIS — E039 Hypothyroidism, unspecified: Secondary | ICD-10-CM | POA: Diagnosis not present

## 2015-10-24 DIAGNOSIS — E782 Mixed hyperlipidemia: Secondary | ICD-10-CM | POA: Diagnosis not present

## 2015-10-24 DIAGNOSIS — M25561 Pain in right knee: Secondary | ICD-10-CM | POA: Diagnosis not present

## 2015-10-24 DIAGNOSIS — J441 Chronic obstructive pulmonary disease with (acute) exacerbation: Secondary | ICD-10-CM | POA: Diagnosis not present

## 2015-10-24 DIAGNOSIS — F419 Anxiety disorder, unspecified: Secondary | ICD-10-CM | POA: Diagnosis not present

## 2015-10-24 DIAGNOSIS — J449 Chronic obstructive pulmonary disease, unspecified: Secondary | ICD-10-CM | POA: Diagnosis not present

## 2015-10-25 DIAGNOSIS — M25561 Pain in right knee: Secondary | ICD-10-CM | POA: Diagnosis not present

## 2015-10-25 DIAGNOSIS — J441 Chronic obstructive pulmonary disease with (acute) exacerbation: Secondary | ICD-10-CM | POA: Diagnosis not present

## 2015-10-25 DIAGNOSIS — E782 Mixed hyperlipidemia: Secondary | ICD-10-CM | POA: Diagnosis not present

## 2015-10-25 DIAGNOSIS — J449 Chronic obstructive pulmonary disease, unspecified: Secondary | ICD-10-CM | POA: Diagnosis not present

## 2015-10-25 DIAGNOSIS — F419 Anxiety disorder, unspecified: Secondary | ICD-10-CM | POA: Diagnosis not present

## 2015-10-25 DIAGNOSIS — E039 Hypothyroidism, unspecified: Secondary | ICD-10-CM | POA: Diagnosis not present

## 2015-10-26 DIAGNOSIS — E039 Hypothyroidism, unspecified: Secondary | ICD-10-CM | POA: Diagnosis not present

## 2015-10-26 DIAGNOSIS — F3132 Bipolar disorder, current episode depressed, moderate: Secondary | ICD-10-CM | POA: Diagnosis not present

## 2015-10-26 DIAGNOSIS — J449 Chronic obstructive pulmonary disease, unspecified: Secondary | ICD-10-CM | POA: Diagnosis not present

## 2015-10-26 DIAGNOSIS — Z720 Tobacco use: Secondary | ICD-10-CM | POA: Diagnosis not present

## 2015-10-26 DIAGNOSIS — F419 Anxiety disorder, unspecified: Secondary | ICD-10-CM | POA: Diagnosis not present

## 2015-10-26 DIAGNOSIS — J441 Chronic obstructive pulmonary disease with (acute) exacerbation: Secondary | ICD-10-CM | POA: Diagnosis not present

## 2015-10-26 DIAGNOSIS — R296 Repeated falls: Secondary | ICD-10-CM | POA: Diagnosis not present

## 2015-10-26 DIAGNOSIS — M25561 Pain in right knee: Secondary | ICD-10-CM | POA: Diagnosis not present

## 2015-10-26 DIAGNOSIS — E782 Mixed hyperlipidemia: Secondary | ICD-10-CM | POA: Diagnosis not present

## 2015-10-26 DIAGNOSIS — R609 Edema, unspecified: Secondary | ICD-10-CM | POA: Diagnosis not present

## 2015-10-29 DIAGNOSIS — J441 Chronic obstructive pulmonary disease with (acute) exacerbation: Secondary | ICD-10-CM | POA: Diagnosis not present

## 2015-10-29 DIAGNOSIS — E039 Hypothyroidism, unspecified: Secondary | ICD-10-CM | POA: Diagnosis not present

## 2015-10-29 DIAGNOSIS — J449 Chronic obstructive pulmonary disease, unspecified: Secondary | ICD-10-CM | POA: Diagnosis not present

## 2015-10-29 DIAGNOSIS — M25561 Pain in right knee: Secondary | ICD-10-CM | POA: Diagnosis not present

## 2015-10-29 DIAGNOSIS — E782 Mixed hyperlipidemia: Secondary | ICD-10-CM | POA: Diagnosis not present

## 2015-10-29 DIAGNOSIS — F419 Anxiety disorder, unspecified: Secondary | ICD-10-CM | POA: Diagnosis not present

## 2015-10-30 DIAGNOSIS — E039 Hypothyroidism, unspecified: Secondary | ICD-10-CM | POA: Diagnosis not present

## 2015-10-30 DIAGNOSIS — E782 Mixed hyperlipidemia: Secondary | ICD-10-CM | POA: Diagnosis not present

## 2015-10-30 DIAGNOSIS — J449 Chronic obstructive pulmonary disease, unspecified: Secondary | ICD-10-CM | POA: Diagnosis not present

## 2015-10-30 DIAGNOSIS — M25561 Pain in right knee: Secondary | ICD-10-CM | POA: Diagnosis not present

## 2015-10-30 DIAGNOSIS — F419 Anxiety disorder, unspecified: Secondary | ICD-10-CM | POA: Diagnosis not present

## 2015-10-30 DIAGNOSIS — J441 Chronic obstructive pulmonary disease with (acute) exacerbation: Secondary | ICD-10-CM | POA: Diagnosis not present

## 2015-10-31 DIAGNOSIS — J441 Chronic obstructive pulmonary disease with (acute) exacerbation: Secondary | ICD-10-CM | POA: Diagnosis not present

## 2015-10-31 DIAGNOSIS — F419 Anxiety disorder, unspecified: Secondary | ICD-10-CM | POA: Diagnosis not present

## 2015-10-31 DIAGNOSIS — E782 Mixed hyperlipidemia: Secondary | ICD-10-CM | POA: Diagnosis not present

## 2015-10-31 DIAGNOSIS — M25561 Pain in right knee: Secondary | ICD-10-CM | POA: Diagnosis not present

## 2015-10-31 DIAGNOSIS — J449 Chronic obstructive pulmonary disease, unspecified: Secondary | ICD-10-CM | POA: Diagnosis not present

## 2015-10-31 DIAGNOSIS — E039 Hypothyroidism, unspecified: Secondary | ICD-10-CM | POA: Diagnosis not present

## 2015-11-01 DIAGNOSIS — E039 Hypothyroidism, unspecified: Secondary | ICD-10-CM | POA: Diagnosis not present

## 2015-11-01 DIAGNOSIS — E782 Mixed hyperlipidemia: Secondary | ICD-10-CM | POA: Diagnosis not present

## 2015-11-01 DIAGNOSIS — J441 Chronic obstructive pulmonary disease with (acute) exacerbation: Secondary | ICD-10-CM | POA: Diagnosis not present

## 2015-11-01 DIAGNOSIS — J449 Chronic obstructive pulmonary disease, unspecified: Secondary | ICD-10-CM | POA: Diagnosis not present

## 2015-11-01 DIAGNOSIS — F419 Anxiety disorder, unspecified: Secondary | ICD-10-CM | POA: Diagnosis not present

## 2015-11-01 DIAGNOSIS — M25561 Pain in right knee: Secondary | ICD-10-CM | POA: Diagnosis not present

## 2015-11-06 DIAGNOSIS — E782 Mixed hyperlipidemia: Secondary | ICD-10-CM | POA: Diagnosis not present

## 2015-11-06 DIAGNOSIS — E039 Hypothyroidism, unspecified: Secondary | ICD-10-CM | POA: Diagnosis not present

## 2015-11-06 DIAGNOSIS — J441 Chronic obstructive pulmonary disease with (acute) exacerbation: Secondary | ICD-10-CM | POA: Diagnosis not present

## 2015-11-06 DIAGNOSIS — F419 Anxiety disorder, unspecified: Secondary | ICD-10-CM | POA: Diagnosis not present

## 2015-11-06 DIAGNOSIS — J449 Chronic obstructive pulmonary disease, unspecified: Secondary | ICD-10-CM | POA: Diagnosis not present

## 2015-11-06 DIAGNOSIS — M25561 Pain in right knee: Secondary | ICD-10-CM | POA: Diagnosis not present

## 2015-11-07 DIAGNOSIS — M25561 Pain in right knee: Secondary | ICD-10-CM | POA: Diagnosis not present

## 2015-11-07 DIAGNOSIS — J449 Chronic obstructive pulmonary disease, unspecified: Secondary | ICD-10-CM | POA: Diagnosis not present

## 2015-11-07 DIAGNOSIS — J441 Chronic obstructive pulmonary disease with (acute) exacerbation: Secondary | ICD-10-CM | POA: Diagnosis not present

## 2015-11-07 DIAGNOSIS — E039 Hypothyroidism, unspecified: Secondary | ICD-10-CM | POA: Diagnosis not present

## 2015-11-07 DIAGNOSIS — F419 Anxiety disorder, unspecified: Secondary | ICD-10-CM | POA: Diagnosis not present

## 2015-11-07 DIAGNOSIS — E782 Mixed hyperlipidemia: Secondary | ICD-10-CM | POA: Diagnosis not present

## 2015-11-08 DIAGNOSIS — J449 Chronic obstructive pulmonary disease, unspecified: Secondary | ICD-10-CM | POA: Diagnosis not present

## 2015-11-08 DIAGNOSIS — F419 Anxiety disorder, unspecified: Secondary | ICD-10-CM | POA: Diagnosis not present

## 2015-11-08 DIAGNOSIS — E782 Mixed hyperlipidemia: Secondary | ICD-10-CM | POA: Diagnosis not present

## 2015-11-08 DIAGNOSIS — J441 Chronic obstructive pulmonary disease with (acute) exacerbation: Secondary | ICD-10-CM | POA: Diagnosis not present

## 2015-11-08 DIAGNOSIS — M25561 Pain in right knee: Secondary | ICD-10-CM | POA: Diagnosis not present

## 2015-11-08 DIAGNOSIS — E039 Hypothyroidism, unspecified: Secondary | ICD-10-CM | POA: Diagnosis not present

## 2015-11-12 DIAGNOSIS — N393 Stress incontinence (female) (male): Secondary | ICD-10-CM | POA: Diagnosis not present

## 2015-11-12 DIAGNOSIS — N3941 Urge incontinence: Secondary | ICD-10-CM | POA: Diagnosis not present

## 2015-11-12 DIAGNOSIS — N905 Atrophy of vulva: Secondary | ICD-10-CM | POA: Diagnosis not present

## 2015-11-12 DIAGNOSIS — R3915 Urgency of urination: Secondary | ICD-10-CM | POA: Diagnosis not present

## 2015-11-13 DIAGNOSIS — M25561 Pain in right knee: Secondary | ICD-10-CM | POA: Diagnosis not present

## 2015-11-13 DIAGNOSIS — J449 Chronic obstructive pulmonary disease, unspecified: Secondary | ICD-10-CM | POA: Diagnosis not present

## 2015-11-13 DIAGNOSIS — J441 Chronic obstructive pulmonary disease with (acute) exacerbation: Secondary | ICD-10-CM | POA: Diagnosis not present

## 2015-11-13 DIAGNOSIS — F419 Anxiety disorder, unspecified: Secondary | ICD-10-CM | POA: Diagnosis not present

## 2015-11-13 DIAGNOSIS — E039 Hypothyroidism, unspecified: Secondary | ICD-10-CM | POA: Diagnosis not present

## 2015-11-13 DIAGNOSIS — E782 Mixed hyperlipidemia: Secondary | ICD-10-CM | POA: Diagnosis not present

## 2015-11-15 DIAGNOSIS — F419 Anxiety disorder, unspecified: Secondary | ICD-10-CM | POA: Diagnosis not present

## 2015-11-15 DIAGNOSIS — J441 Chronic obstructive pulmonary disease with (acute) exacerbation: Secondary | ICD-10-CM | POA: Diagnosis not present

## 2015-11-15 DIAGNOSIS — J449 Chronic obstructive pulmonary disease, unspecified: Secondary | ICD-10-CM | POA: Diagnosis not present

## 2015-11-15 DIAGNOSIS — M25561 Pain in right knee: Secondary | ICD-10-CM | POA: Diagnosis not present

## 2015-11-15 DIAGNOSIS — E039 Hypothyroidism, unspecified: Secondary | ICD-10-CM | POA: Diagnosis not present

## 2015-11-15 DIAGNOSIS — E782 Mixed hyperlipidemia: Secondary | ICD-10-CM | POA: Diagnosis not present

## 2015-11-21 DIAGNOSIS — E039 Hypothyroidism, unspecified: Secondary | ICD-10-CM | POA: Diagnosis not present

## 2015-11-21 DIAGNOSIS — J441 Chronic obstructive pulmonary disease with (acute) exacerbation: Secondary | ICD-10-CM | POA: Diagnosis not present

## 2015-11-21 DIAGNOSIS — E782 Mixed hyperlipidemia: Secondary | ICD-10-CM | POA: Diagnosis not present

## 2015-11-21 DIAGNOSIS — J449 Chronic obstructive pulmonary disease, unspecified: Secondary | ICD-10-CM | POA: Diagnosis not present

## 2015-11-21 DIAGNOSIS — F419 Anxiety disorder, unspecified: Secondary | ICD-10-CM | POA: Diagnosis not present

## 2015-11-21 DIAGNOSIS — M25561 Pain in right knee: Secondary | ICD-10-CM | POA: Diagnosis not present

## 2015-11-23 DIAGNOSIS — J449 Chronic obstructive pulmonary disease, unspecified: Secondary | ICD-10-CM | POA: Diagnosis not present

## 2015-11-23 DIAGNOSIS — E782 Mixed hyperlipidemia: Secondary | ICD-10-CM | POA: Diagnosis not present

## 2015-11-23 DIAGNOSIS — F419 Anxiety disorder, unspecified: Secondary | ICD-10-CM | POA: Diagnosis not present

## 2015-11-23 DIAGNOSIS — M25561 Pain in right knee: Secondary | ICD-10-CM | POA: Diagnosis not present

## 2015-11-23 DIAGNOSIS — E039 Hypothyroidism, unspecified: Secondary | ICD-10-CM | POA: Diagnosis not present

## 2015-11-23 DIAGNOSIS — J441 Chronic obstructive pulmonary disease with (acute) exacerbation: Secondary | ICD-10-CM | POA: Diagnosis not present

## 2015-11-28 DIAGNOSIS — J441 Chronic obstructive pulmonary disease with (acute) exacerbation: Secondary | ICD-10-CM | POA: Diagnosis not present

## 2015-11-28 DIAGNOSIS — M25561 Pain in right knee: Secondary | ICD-10-CM | POA: Diagnosis not present

## 2015-11-28 DIAGNOSIS — J449 Chronic obstructive pulmonary disease, unspecified: Secondary | ICD-10-CM | POA: Diagnosis not present

## 2015-11-28 DIAGNOSIS — E782 Mixed hyperlipidemia: Secondary | ICD-10-CM | POA: Diagnosis not present

## 2015-11-28 DIAGNOSIS — F419 Anxiety disorder, unspecified: Secondary | ICD-10-CM | POA: Diagnosis not present

## 2015-11-28 DIAGNOSIS — E039 Hypothyroidism, unspecified: Secondary | ICD-10-CM | POA: Diagnosis not present

## 2015-11-29 DIAGNOSIS — M25561 Pain in right knee: Secondary | ICD-10-CM | POA: Diagnosis not present

## 2015-11-29 DIAGNOSIS — E039 Hypothyroidism, unspecified: Secondary | ICD-10-CM | POA: Diagnosis not present

## 2015-11-29 DIAGNOSIS — E782 Mixed hyperlipidemia: Secondary | ICD-10-CM | POA: Diagnosis not present

## 2015-11-29 DIAGNOSIS — F419 Anxiety disorder, unspecified: Secondary | ICD-10-CM | POA: Diagnosis not present

## 2015-11-29 DIAGNOSIS — J441 Chronic obstructive pulmonary disease with (acute) exacerbation: Secondary | ICD-10-CM | POA: Diagnosis not present

## 2015-11-29 DIAGNOSIS — J449 Chronic obstructive pulmonary disease, unspecified: Secondary | ICD-10-CM | POA: Diagnosis not present

## 2015-11-30 DIAGNOSIS — Z6832 Body mass index (BMI) 32.0-32.9, adult: Secondary | ICD-10-CM | POA: Diagnosis not present

## 2015-11-30 DIAGNOSIS — R609 Edema, unspecified: Secondary | ICD-10-CM | POA: Diagnosis not present

## 2015-11-30 DIAGNOSIS — R32 Unspecified urinary incontinence: Secondary | ICD-10-CM | POA: Diagnosis not present

## 2015-11-30 DIAGNOSIS — L0390 Cellulitis, unspecified: Secondary | ICD-10-CM | POA: Diagnosis not present

## 2015-12-05 DIAGNOSIS — J449 Chronic obstructive pulmonary disease, unspecified: Secondary | ICD-10-CM | POA: Diagnosis not present

## 2015-12-05 DIAGNOSIS — M25561 Pain in right knee: Secondary | ICD-10-CM | POA: Diagnosis not present

## 2015-12-05 DIAGNOSIS — E782 Mixed hyperlipidemia: Secondary | ICD-10-CM | POA: Diagnosis not present

## 2015-12-05 DIAGNOSIS — J441 Chronic obstructive pulmonary disease with (acute) exacerbation: Secondary | ICD-10-CM | POA: Diagnosis not present

## 2015-12-05 DIAGNOSIS — F419 Anxiety disorder, unspecified: Secondary | ICD-10-CM | POA: Diagnosis not present

## 2015-12-05 DIAGNOSIS — E039 Hypothyroidism, unspecified: Secondary | ICD-10-CM | POA: Diagnosis not present

## 2015-12-11 DIAGNOSIS — N3941 Urge incontinence: Secondary | ICD-10-CM | POA: Diagnosis not present

## 2015-12-11 DIAGNOSIS — R3915 Urgency of urination: Secondary | ICD-10-CM | POA: Diagnosis not present

## 2015-12-11 DIAGNOSIS — N905 Atrophy of vulva: Secondary | ICD-10-CM | POA: Diagnosis not present

## 2015-12-13 DIAGNOSIS — M25561 Pain in right knee: Secondary | ICD-10-CM | POA: Diagnosis not present

## 2015-12-13 DIAGNOSIS — E039 Hypothyroidism, unspecified: Secondary | ICD-10-CM | POA: Diagnosis not present

## 2015-12-13 DIAGNOSIS — E569 Vitamin deficiency, unspecified: Secondary | ICD-10-CM | POA: Diagnosis not present

## 2015-12-13 DIAGNOSIS — R32 Unspecified urinary incontinence: Secondary | ICD-10-CM | POA: Diagnosis not present

## 2015-12-13 DIAGNOSIS — Z6832 Body mass index (BMI) 32.0-32.9, adult: Secondary | ICD-10-CM | POA: Diagnosis not present

## 2015-12-13 DIAGNOSIS — D649 Anemia, unspecified: Secondary | ICD-10-CM | POA: Diagnosis not present

## 2015-12-14 DIAGNOSIS — Z7951 Long term (current) use of inhaled steroids: Secondary | ICD-10-CM | POA: Diagnosis not present

## 2015-12-14 DIAGNOSIS — J449 Chronic obstructive pulmonary disease, unspecified: Secondary | ICD-10-CM | POA: Diagnosis not present

## 2015-12-14 DIAGNOSIS — S0990XA Unspecified injury of head, initial encounter: Secondary | ICD-10-CM | POA: Diagnosis not present

## 2015-12-14 DIAGNOSIS — S0093XA Contusion of unspecified part of head, initial encounter: Secondary | ICD-10-CM | POA: Diagnosis not present

## 2015-12-14 DIAGNOSIS — Z79899 Other long term (current) drug therapy: Secondary | ICD-10-CM | POA: Diagnosis not present

## 2015-12-14 DIAGNOSIS — S161XXA Strain of muscle, fascia and tendon at neck level, initial encounter: Secondary | ICD-10-CM | POA: Diagnosis not present

## 2015-12-14 DIAGNOSIS — E039 Hypothyroidism, unspecified: Secondary | ICD-10-CM | POA: Diagnosis not present

## 2015-12-14 DIAGNOSIS — E7800 Pure hypercholesterolemia, unspecified: Secondary | ICD-10-CM | POA: Diagnosis not present

## 2015-12-14 DIAGNOSIS — M542 Cervicalgia: Secondary | ICD-10-CM | POA: Diagnosis not present

## 2015-12-14 DIAGNOSIS — R030 Elevated blood-pressure reading, without diagnosis of hypertension: Secondary | ICD-10-CM | POA: Diagnosis not present

## 2015-12-14 DIAGNOSIS — Z87891 Personal history of nicotine dependence: Secondary | ICD-10-CM | POA: Diagnosis not present

## 2015-12-14 DIAGNOSIS — R52 Pain, unspecified: Secondary | ICD-10-CM | POA: Diagnosis not present

## 2015-12-14 DIAGNOSIS — F419 Anxiety disorder, unspecified: Secondary | ICD-10-CM | POA: Diagnosis not present

## 2015-12-14 DIAGNOSIS — E669 Obesity, unspecified: Secondary | ICD-10-CM | POA: Diagnosis not present

## 2015-12-14 DIAGNOSIS — W1839XA Other fall on same level, initial encounter: Secondary | ICD-10-CM | POA: Diagnosis not present

## 2015-12-17 DIAGNOSIS — M1711 Unilateral primary osteoarthritis, right knee: Secondary | ICD-10-CM | POA: Diagnosis not present

## 2015-12-17 DIAGNOSIS — Z23 Encounter for immunization: Secondary | ICD-10-CM | POA: Diagnosis not present

## 2015-12-17 DIAGNOSIS — M25561 Pain in right knee: Secondary | ICD-10-CM | POA: Diagnosis not present

## 2016-01-10 DIAGNOSIS — R609 Edema, unspecified: Secondary | ICD-10-CM | POA: Diagnosis not present

## 2016-01-10 DIAGNOSIS — Z6833 Body mass index (BMI) 33.0-33.9, adult: Secondary | ICD-10-CM | POA: Diagnosis not present

## 2016-01-10 DIAGNOSIS — D649 Anemia, unspecified: Secondary | ICD-10-CM | POA: Diagnosis not present

## 2016-01-10 DIAGNOSIS — H6121 Impacted cerumen, right ear: Secondary | ICD-10-CM | POA: Diagnosis not present

## 2016-01-21 DIAGNOSIS — R3915 Urgency of urination: Secondary | ICD-10-CM | POA: Diagnosis not present

## 2016-01-21 DIAGNOSIS — N3941 Urge incontinence: Secondary | ICD-10-CM | POA: Diagnosis not present

## 2016-02-26 DIAGNOSIS — R3915 Urgency of urination: Secondary | ICD-10-CM | POA: Diagnosis not present

## 2016-02-26 DIAGNOSIS — N3941 Urge incontinence: Secondary | ICD-10-CM | POA: Diagnosis not present

## 2016-03-05 DIAGNOSIS — L989 Disorder of the skin and subcutaneous tissue, unspecified: Secondary | ICD-10-CM | POA: Diagnosis not present

## 2016-03-17 DIAGNOSIS — G629 Polyneuropathy, unspecified: Secondary | ICD-10-CM | POA: Diagnosis not present

## 2016-03-17 DIAGNOSIS — G250 Essential tremor: Secondary | ICD-10-CM | POA: Diagnosis not present

## 2016-03-18 DIAGNOSIS — M1711 Unilateral primary osteoarthritis, right knee: Secondary | ICD-10-CM | POA: Diagnosis not present

## 2016-03-18 DIAGNOSIS — M25561 Pain in right knee: Secondary | ICD-10-CM | POA: Diagnosis not present

## 2016-03-24 DIAGNOSIS — E782 Mixed hyperlipidemia: Secondary | ICD-10-CM | POA: Diagnosis not present

## 2016-03-24 DIAGNOSIS — Z720 Tobacco use: Secondary | ICD-10-CM | POA: Diagnosis not present

## 2016-03-24 DIAGNOSIS — F419 Anxiety disorder, unspecified: Secondary | ICD-10-CM | POA: Diagnosis not present

## 2016-03-24 DIAGNOSIS — M25561 Pain in right knee: Secondary | ICD-10-CM | POA: Diagnosis not present

## 2016-03-24 DIAGNOSIS — J441 Chronic obstructive pulmonary disease with (acute) exacerbation: Secondary | ICD-10-CM | POA: Diagnosis not present

## 2016-03-24 DIAGNOSIS — E039 Hypothyroidism, unspecified: Secondary | ICD-10-CM | POA: Diagnosis not present

## 2016-03-24 DIAGNOSIS — Z6832 Body mass index (BMI) 32.0-32.9, adult: Secondary | ICD-10-CM | POA: Diagnosis not present

## 2016-03-24 DIAGNOSIS — F3132 Bipolar disorder, current episode depressed, moderate: Secondary | ICD-10-CM | POA: Diagnosis not present

## 2016-04-02 DIAGNOSIS — J441 Chronic obstructive pulmonary disease with (acute) exacerbation: Secondary | ICD-10-CM | POA: Diagnosis not present

## 2016-04-02 DIAGNOSIS — F3132 Bipolar disorder, current episode depressed, moderate: Secondary | ICD-10-CM | POA: Diagnosis not present

## 2016-04-02 DIAGNOSIS — Z9114 Patient's other noncompliance with medication regimen: Secondary | ICD-10-CM | POA: Diagnosis not present

## 2016-04-10 DIAGNOSIS — J441 Chronic obstructive pulmonary disease with (acute) exacerbation: Secondary | ICD-10-CM | POA: Diagnosis not present

## 2016-05-15 DIAGNOSIS — J441 Chronic obstructive pulmonary disease with (acute) exacerbation: Secondary | ICD-10-CM | POA: Diagnosis not present

## 2016-05-15 DIAGNOSIS — F419 Anxiety disorder, unspecified: Secondary | ICD-10-CM | POA: Diagnosis not present

## 2016-05-15 DIAGNOSIS — E039 Hypothyroidism, unspecified: Secondary | ICD-10-CM | POA: Diagnosis not present

## 2016-05-15 DIAGNOSIS — I872 Venous insufficiency (chronic) (peripheral): Secondary | ICD-10-CM | POA: Diagnosis not present

## 2016-05-15 DIAGNOSIS — R296 Repeated falls: Secondary | ICD-10-CM | POA: Diagnosis not present

## 2016-05-15 DIAGNOSIS — R609 Edema, unspecified: Secondary | ICD-10-CM | POA: Diagnosis not present

## 2016-05-15 DIAGNOSIS — E782 Mixed hyperlipidemia: Secondary | ICD-10-CM | POA: Diagnosis not present

## 2016-05-15 DIAGNOSIS — F3132 Bipolar disorder, current episode depressed, moderate: Secondary | ICD-10-CM | POA: Diagnosis not present

## 2016-06-18 DIAGNOSIS — M25561 Pain in right knee: Secondary | ICD-10-CM | POA: Diagnosis not present

## 2016-06-18 DIAGNOSIS — M25562 Pain in left knee: Secondary | ICD-10-CM | POA: Diagnosis not present

## 2016-06-18 DIAGNOSIS — M170 Bilateral primary osteoarthritis of knee: Secondary | ICD-10-CM | POA: Diagnosis not present

## 2016-06-26 DIAGNOSIS — J441 Chronic obstructive pulmonary disease with (acute) exacerbation: Secondary | ICD-10-CM | POA: Diagnosis not present

## 2016-06-26 DIAGNOSIS — L03116 Cellulitis of left lower limb: Secondary | ICD-10-CM | POA: Diagnosis not present

## 2016-06-26 DIAGNOSIS — F3132 Bipolar disorder, current episode depressed, moderate: Secondary | ICD-10-CM | POA: Diagnosis not present

## 2016-06-26 DIAGNOSIS — E039 Hypothyroidism, unspecified: Secondary | ICD-10-CM | POA: Diagnosis not present

## 2016-06-26 DIAGNOSIS — L03115 Cellulitis of right lower limb: Secondary | ICD-10-CM | POA: Diagnosis not present

## 2016-06-26 DIAGNOSIS — E782 Mixed hyperlipidemia: Secondary | ICD-10-CM | POA: Diagnosis not present

## 2016-06-26 DIAGNOSIS — F419 Anxiety disorder, unspecified: Secondary | ICD-10-CM | POA: Diagnosis not present

## 2016-06-26 DIAGNOSIS — R609 Edema, unspecified: Secondary | ICD-10-CM | POA: Diagnosis not present

## 2016-07-08 DIAGNOSIS — J449 Chronic obstructive pulmonary disease, unspecified: Secondary | ICD-10-CM | POA: Diagnosis not present

## 2016-07-08 DIAGNOSIS — L97811 Non-pressure chronic ulcer of other part of right lower leg limited to breakdown of skin: Secondary | ICD-10-CM | POA: Diagnosis not present

## 2016-07-08 DIAGNOSIS — M1711 Unilateral primary osteoarthritis, right knee: Secondary | ICD-10-CM | POA: Diagnosis not present

## 2016-07-08 DIAGNOSIS — Z79899 Other long term (current) drug therapy: Secondary | ICD-10-CM | POA: Diagnosis not present

## 2016-07-08 DIAGNOSIS — Z87891 Personal history of nicotine dependence: Secondary | ICD-10-CM | POA: Diagnosis not present

## 2016-07-08 DIAGNOSIS — I83018 Varicose veins of right lower extremity with ulcer other part of lower leg: Secondary | ICD-10-CM | POA: Diagnosis not present

## 2016-07-08 DIAGNOSIS — L97821 Non-pressure chronic ulcer of other part of left lower leg limited to breakdown of skin: Secondary | ICD-10-CM | POA: Diagnosis not present

## 2016-07-08 DIAGNOSIS — I872 Venous insufficiency (chronic) (peripheral): Secondary | ICD-10-CM | POA: Diagnosis not present

## 2016-07-16 DIAGNOSIS — J449 Chronic obstructive pulmonary disease, unspecified: Secondary | ICD-10-CM | POA: Diagnosis not present

## 2016-07-16 DIAGNOSIS — Z79899 Other long term (current) drug therapy: Secondary | ICD-10-CM | POA: Diagnosis not present

## 2016-07-16 DIAGNOSIS — I83018 Varicose veins of right lower extremity with ulcer other part of lower leg: Secondary | ICD-10-CM | POA: Diagnosis not present

## 2016-07-16 DIAGNOSIS — M1711 Unilateral primary osteoarthritis, right knee: Secondary | ICD-10-CM | POA: Diagnosis not present

## 2016-07-16 DIAGNOSIS — Z87891 Personal history of nicotine dependence: Secondary | ICD-10-CM | POA: Diagnosis not present

## 2016-07-16 DIAGNOSIS — L97821 Non-pressure chronic ulcer of other part of left lower leg limited to breakdown of skin: Secondary | ICD-10-CM | POA: Diagnosis not present

## 2016-07-16 DIAGNOSIS — I872 Venous insufficiency (chronic) (peripheral): Secondary | ICD-10-CM | POA: Diagnosis not present

## 2016-07-16 DIAGNOSIS — L97811 Non-pressure chronic ulcer of other part of right lower leg limited to breakdown of skin: Secondary | ICD-10-CM | POA: Diagnosis not present

## 2016-07-23 DIAGNOSIS — I872 Venous insufficiency (chronic) (peripheral): Secondary | ICD-10-CM | POA: Diagnosis not present

## 2016-07-23 DIAGNOSIS — I83018 Varicose veins of right lower extremity with ulcer other part of lower leg: Secondary | ICD-10-CM | POA: Diagnosis not present

## 2016-07-23 DIAGNOSIS — L97821 Non-pressure chronic ulcer of other part of left lower leg limited to breakdown of skin: Secondary | ICD-10-CM | POA: Diagnosis not present

## 2016-07-23 DIAGNOSIS — L97811 Non-pressure chronic ulcer of other part of right lower leg limited to breakdown of skin: Secondary | ICD-10-CM | POA: Diagnosis not present

## 2016-08-20 DIAGNOSIS — Z79899 Other long term (current) drug therapy: Secondary | ICD-10-CM | POA: Diagnosis not present

## 2016-08-20 DIAGNOSIS — R531 Weakness: Secondary | ICD-10-CM | POA: Diagnosis not present

## 2016-08-20 DIAGNOSIS — S8002XA Contusion of left knee, initial encounter: Secondary | ICD-10-CM | POA: Diagnosis not present

## 2016-08-20 DIAGNOSIS — M7989 Other specified soft tissue disorders: Secondary | ICD-10-CM | POA: Diagnosis not present

## 2016-08-20 DIAGNOSIS — M25562 Pain in left knee: Secondary | ICD-10-CM | POA: Diagnosis not present

## 2016-08-20 DIAGNOSIS — R509 Fever, unspecified: Secondary | ICD-10-CM | POA: Diagnosis not present

## 2016-08-20 DIAGNOSIS — M170 Bilateral primary osteoarthritis of knee: Secondary | ICD-10-CM | POA: Diagnosis not present

## 2016-08-20 DIAGNOSIS — E7800 Pure hypercholesterolemia, unspecified: Secondary | ICD-10-CM | POA: Diagnosis not present

## 2016-08-20 DIAGNOSIS — F419 Anxiety disorder, unspecified: Secondary | ICD-10-CM | POA: Diagnosis not present

## 2016-08-20 DIAGNOSIS — J449 Chronic obstructive pulmonary disease, unspecified: Secondary | ICD-10-CM | POA: Diagnosis not present

## 2016-08-20 DIAGNOSIS — M25561 Pain in right knee: Secondary | ICD-10-CM | POA: Diagnosis not present

## 2016-08-20 DIAGNOSIS — R404 Transient alteration of awareness: Secondary | ICD-10-CM | POA: Diagnosis not present

## 2016-08-20 DIAGNOSIS — F319 Bipolar disorder, unspecified: Secondary | ICD-10-CM | POA: Diagnosis not present

## 2016-08-20 DIAGNOSIS — Z9981 Dependence on supplemental oxygen: Secondary | ICD-10-CM | POA: Diagnosis not present

## 2016-08-20 DIAGNOSIS — S8992XA Unspecified injury of left lower leg, initial encounter: Secondary | ICD-10-CM | POA: Diagnosis not present

## 2016-08-20 DIAGNOSIS — E039 Hypothyroidism, unspecified: Secondary | ICD-10-CM | POA: Diagnosis not present

## 2016-08-20 DIAGNOSIS — Z7951 Long term (current) use of inhaled steroids: Secondary | ICD-10-CM | POA: Diagnosis not present

## 2016-08-20 DIAGNOSIS — S8001XA Contusion of right knee, initial encounter: Secondary | ICD-10-CM | POA: Diagnosis not present

## 2016-08-28 DIAGNOSIS — F319 Bipolar disorder, unspecified: Secondary | ICD-10-CM | POA: Diagnosis not present

## 2016-08-28 DIAGNOSIS — R1312 Dysphagia, oropharyngeal phase: Secondary | ICD-10-CM | POA: Diagnosis not present

## 2016-08-28 DIAGNOSIS — N390 Urinary tract infection, site not specified: Secondary | ICD-10-CM | POA: Diagnosis not present

## 2016-08-28 DIAGNOSIS — R531 Weakness: Secondary | ICD-10-CM | POA: Diagnosis present

## 2016-08-28 DIAGNOSIS — R0682 Tachypnea, not elsewhere classified: Secondary | ICD-10-CM | POA: Diagnosis not present

## 2016-08-28 DIAGNOSIS — Z79899 Other long term (current) drug therapy: Secondary | ICD-10-CM | POA: Diagnosis not present

## 2016-08-28 DIAGNOSIS — Z833 Family history of diabetes mellitus: Secondary | ICD-10-CM | POA: Diagnosis not present

## 2016-08-28 DIAGNOSIS — G20 Parkinson's disease: Secondary | ICD-10-CM | POA: Diagnosis not present

## 2016-08-28 DIAGNOSIS — R0602 Shortness of breath: Secondary | ICD-10-CM | POA: Diagnosis not present

## 2016-08-28 DIAGNOSIS — Z6834 Body mass index (BMI) 34.0-34.9, adult: Secondary | ICD-10-CM | POA: Diagnosis not present

## 2016-08-28 DIAGNOSIS — R062 Wheezing: Secondary | ICD-10-CM | POA: Diagnosis not present

## 2016-08-28 DIAGNOSIS — F419 Anxiety disorder, unspecified: Secondary | ICD-10-CM | POA: Diagnosis present

## 2016-08-28 DIAGNOSIS — R319 Hematuria, unspecified: Secondary | ICD-10-CM | POA: Diagnosis not present

## 2016-08-28 DIAGNOSIS — J441 Chronic obstructive pulmonary disease with (acute) exacerbation: Secondary | ICD-10-CM | POA: Diagnosis not present

## 2016-08-28 DIAGNOSIS — Z9181 History of falling: Secondary | ICD-10-CM | POA: Diagnosis not present

## 2016-08-28 DIAGNOSIS — F411 Generalized anxiety disorder: Secondary | ICD-10-CM | POA: Diagnosis not present

## 2016-08-28 DIAGNOSIS — E781 Pure hyperglyceridemia: Secondary | ICD-10-CM | POA: Diagnosis not present

## 2016-08-28 DIAGNOSIS — Z743 Need for continuous supervision: Secondary | ICD-10-CM | POA: Diagnosis not present

## 2016-08-28 DIAGNOSIS — R293 Abnormal posture: Secondary | ICD-10-CM | POA: Diagnosis not present

## 2016-08-28 DIAGNOSIS — E7800 Pure hypercholesterolemia, unspecified: Secondary | ICD-10-CM | POA: Diagnosis present

## 2016-08-28 DIAGNOSIS — Z8249 Family history of ischemic heart disease and other diseases of the circulatory system: Secondary | ICD-10-CM | POA: Diagnosis not present

## 2016-08-28 DIAGNOSIS — F0390 Unspecified dementia without behavioral disturbance: Secondary | ICD-10-CM | POA: Diagnosis not present

## 2016-08-28 DIAGNOSIS — R4182 Altered mental status, unspecified: Secondary | ICD-10-CM | POA: Diagnosis not present

## 2016-08-28 DIAGNOSIS — E669 Obesity, unspecified: Secondary | ICD-10-CM | POA: Diagnosis present

## 2016-08-28 DIAGNOSIS — M6281 Muscle weakness (generalized): Secondary | ICD-10-CM | POA: Diagnosis not present

## 2016-08-28 DIAGNOSIS — R918 Other nonspecific abnormal finding of lung field: Secondary | ICD-10-CM | POA: Diagnosis not present

## 2016-08-28 DIAGNOSIS — J449 Chronic obstructive pulmonary disease, unspecified: Secondary | ICD-10-CM | POA: Diagnosis not present

## 2016-08-28 DIAGNOSIS — I482 Chronic atrial fibrillation: Secondary | ICD-10-CM | POA: Diagnosis not present

## 2016-08-28 DIAGNOSIS — E038 Other specified hypothyroidism: Secondary | ICD-10-CM | POA: Diagnosis not present

## 2016-08-28 DIAGNOSIS — M436 Torticollis: Secondary | ICD-10-CM | POA: Diagnosis present

## 2016-08-28 DIAGNOSIS — D649 Anemia, unspecified: Secondary | ICD-10-CM | POA: Diagnosis present

## 2016-08-28 DIAGNOSIS — R509 Fever, unspecified: Secondary | ICD-10-CM | POA: Diagnosis not present

## 2016-08-28 DIAGNOSIS — Z809 Family history of malignant neoplasm, unspecified: Secondary | ICD-10-CM | POA: Diagnosis not present

## 2016-08-28 DIAGNOSIS — J9621 Acute and chronic respiratory failure with hypoxia: Secondary | ICD-10-CM | POA: Diagnosis not present

## 2016-08-28 DIAGNOSIS — E039 Hypothyroidism, unspecified: Secondary | ICD-10-CM | POA: Diagnosis not present

## 2016-08-28 DIAGNOSIS — R279 Unspecified lack of coordination: Secondary | ICD-10-CM | POA: Diagnosis not present

## 2016-08-28 DIAGNOSIS — E785 Hyperlipidemia, unspecified: Secondary | ICD-10-CM | POA: Diagnosis present

## 2016-08-28 DIAGNOSIS — Z825 Family history of asthma and other chronic lower respiratory diseases: Secondary | ICD-10-CM | POA: Diagnosis not present

## 2016-08-28 DIAGNOSIS — R41841 Cognitive communication deficit: Secondary | ICD-10-CM | POA: Diagnosis not present

## 2016-08-28 DIAGNOSIS — Z9981 Dependence on supplemental oxygen: Secondary | ICD-10-CM | POA: Diagnosis not present

## 2016-09-02 DIAGNOSIS — M170 Bilateral primary osteoarthritis of knee: Secondary | ICD-10-CM | POA: Diagnosis not present

## 2016-09-02 DIAGNOSIS — R4182 Altered mental status, unspecified: Secondary | ICD-10-CM | POA: Diagnosis not present

## 2016-09-02 DIAGNOSIS — E038 Other specified hypothyroidism: Secondary | ICD-10-CM | POA: Diagnosis not present

## 2016-09-02 DIAGNOSIS — J449 Chronic obstructive pulmonary disease, unspecified: Secondary | ICD-10-CM | POA: Diagnosis not present

## 2016-09-02 DIAGNOSIS — I872 Venous insufficiency (chronic) (peripheral): Secondary | ICD-10-CM | POA: Diagnosis not present

## 2016-09-02 DIAGNOSIS — E119 Type 2 diabetes mellitus without complications: Secondary | ICD-10-CM | POA: Diagnosis not present

## 2016-09-02 DIAGNOSIS — F419 Anxiety disorder, unspecified: Secondary | ICD-10-CM | POA: Diagnosis not present

## 2016-09-02 DIAGNOSIS — R296 Repeated falls: Secondary | ICD-10-CM | POA: Diagnosis not present

## 2016-09-02 DIAGNOSIS — Z79899 Other long term (current) drug therapy: Secondary | ICD-10-CM | POA: Diagnosis not present

## 2016-09-02 DIAGNOSIS — R1312 Dysphagia, oropharyngeal phase: Secondary | ICD-10-CM | POA: Diagnosis not present

## 2016-09-02 DIAGNOSIS — R293 Abnormal posture: Secondary | ICD-10-CM | POA: Diagnosis not present

## 2016-09-02 DIAGNOSIS — M436 Torticollis: Secondary | ICD-10-CM | POA: Diagnosis not present

## 2016-09-02 DIAGNOSIS — Z743 Need for continuous supervision: Secondary | ICD-10-CM | POA: Diagnosis not present

## 2016-09-02 DIAGNOSIS — R279 Unspecified lack of coordination: Secondary | ICD-10-CM | POA: Diagnosis not present

## 2016-09-02 DIAGNOSIS — R609 Edema, unspecified: Secondary | ICD-10-CM | POA: Diagnosis not present

## 2016-09-02 DIAGNOSIS — E781 Pure hyperglyceridemia: Secondary | ICD-10-CM | POA: Diagnosis not present

## 2016-09-02 DIAGNOSIS — G20 Parkinson's disease: Secondary | ICD-10-CM | POA: Diagnosis not present

## 2016-09-02 DIAGNOSIS — M6281 Muscle weakness (generalized): Secondary | ICD-10-CM | POA: Diagnosis not present

## 2016-09-02 DIAGNOSIS — N390 Urinary tract infection, site not specified: Secondary | ICD-10-CM | POA: Diagnosis not present

## 2016-09-02 DIAGNOSIS — G250 Essential tremor: Secondary | ICD-10-CM | POA: Diagnosis not present

## 2016-09-02 DIAGNOSIS — F319 Bipolar disorder, unspecified: Secondary | ICD-10-CM | POA: Diagnosis not present

## 2016-09-02 DIAGNOSIS — M25562 Pain in left knee: Secondary | ICD-10-CM | POA: Diagnosis not present

## 2016-09-02 DIAGNOSIS — E669 Obesity, unspecified: Secondary | ICD-10-CM | POA: Diagnosis not present

## 2016-09-02 DIAGNOSIS — D518 Other vitamin B12 deficiency anemias: Secondary | ICD-10-CM | POA: Diagnosis not present

## 2016-09-02 DIAGNOSIS — E782 Mixed hyperlipidemia: Secondary | ICD-10-CM | POA: Diagnosis not present

## 2016-09-02 DIAGNOSIS — R41841 Cognitive communication deficit: Secondary | ICD-10-CM | POA: Diagnosis not present

## 2016-09-02 DIAGNOSIS — F411 Generalized anxiety disorder: Secondary | ICD-10-CM | POA: Diagnosis not present

## 2016-09-02 DIAGNOSIS — E785 Hyperlipidemia, unspecified: Secondary | ICD-10-CM | POA: Diagnosis not present

## 2016-09-02 DIAGNOSIS — J441 Chronic obstructive pulmonary disease with (acute) exacerbation: Secondary | ICD-10-CM | POA: Diagnosis not present

## 2016-09-02 DIAGNOSIS — M25561 Pain in right knee: Secondary | ICD-10-CM | POA: Diagnosis not present

## 2016-09-02 DIAGNOSIS — G629 Polyneuropathy, unspecified: Secondary | ICD-10-CM | POA: Diagnosis not present

## 2016-09-02 DIAGNOSIS — F3132 Bipolar disorder, current episode depressed, moderate: Secondary | ICD-10-CM | POA: Diagnosis not present

## 2016-09-09 DIAGNOSIS — E119 Type 2 diabetes mellitus without complications: Secondary | ICD-10-CM | POA: Diagnosis not present

## 2016-09-09 DIAGNOSIS — D518 Other vitamin B12 deficiency anemias: Secondary | ICD-10-CM | POA: Diagnosis not present

## 2016-09-09 DIAGNOSIS — E782 Mixed hyperlipidemia: Secondary | ICD-10-CM | POA: Diagnosis not present

## 2016-09-09 DIAGNOSIS — Z79899 Other long term (current) drug therapy: Secondary | ICD-10-CM | POA: Diagnosis not present

## 2016-09-10 DIAGNOSIS — F319 Bipolar disorder, unspecified: Secondary | ICD-10-CM | POA: Diagnosis not present

## 2016-09-10 DIAGNOSIS — J449 Chronic obstructive pulmonary disease, unspecified: Secondary | ICD-10-CM | POA: Diagnosis not present

## 2016-09-10 DIAGNOSIS — F411 Generalized anxiety disorder: Secondary | ICD-10-CM | POA: Diagnosis not present

## 2016-09-10 DIAGNOSIS — E038 Other specified hypothyroidism: Secondary | ICD-10-CM | POA: Diagnosis not present

## 2016-09-13 DIAGNOSIS — E038 Other specified hypothyroidism: Secondary | ICD-10-CM | POA: Diagnosis not present

## 2016-09-13 DIAGNOSIS — J449 Chronic obstructive pulmonary disease, unspecified: Secondary | ICD-10-CM | POA: Diagnosis not present

## 2016-09-13 DIAGNOSIS — F319 Bipolar disorder, unspecified: Secondary | ICD-10-CM | POA: Diagnosis not present

## 2016-09-13 DIAGNOSIS — F411 Generalized anxiety disorder: Secondary | ICD-10-CM | POA: Diagnosis not present

## 2016-09-16 DIAGNOSIS — R296 Repeated falls: Secondary | ICD-10-CM | POA: Diagnosis not present

## 2016-09-16 DIAGNOSIS — F419 Anxiety disorder, unspecified: Secondary | ICD-10-CM | POA: Diagnosis not present

## 2016-09-16 DIAGNOSIS — J441 Chronic obstructive pulmonary disease with (acute) exacerbation: Secondary | ICD-10-CM | POA: Diagnosis not present

## 2016-09-16 DIAGNOSIS — I872 Venous insufficiency (chronic) (peripheral): Secondary | ICD-10-CM | POA: Diagnosis not present

## 2016-09-16 DIAGNOSIS — F3132 Bipolar disorder, current episode depressed, moderate: Secondary | ICD-10-CM | POA: Diagnosis not present

## 2016-09-16 DIAGNOSIS — R609 Edema, unspecified: Secondary | ICD-10-CM | POA: Diagnosis not present

## 2016-09-17 DIAGNOSIS — G20 Parkinson's disease: Secondary | ICD-10-CM | POA: Diagnosis not present

## 2016-09-17 DIAGNOSIS — E038 Other specified hypothyroidism: Secondary | ICD-10-CM | POA: Diagnosis not present

## 2016-09-17 DIAGNOSIS — J449 Chronic obstructive pulmonary disease, unspecified: Secondary | ICD-10-CM | POA: Diagnosis not present

## 2016-09-17 DIAGNOSIS — F319 Bipolar disorder, unspecified: Secondary | ICD-10-CM | POA: Diagnosis not present

## 2016-09-17 DIAGNOSIS — G629 Polyneuropathy, unspecified: Secondary | ICD-10-CM | POA: Diagnosis not present

## 2016-09-17 DIAGNOSIS — E781 Pure hyperglyceridemia: Secondary | ICD-10-CM | POA: Diagnosis not present

## 2016-09-17 DIAGNOSIS — G250 Essential tremor: Secondary | ICD-10-CM | POA: Diagnosis not present

## 2016-10-01 DIAGNOSIS — F319 Bipolar disorder, unspecified: Secondary | ICD-10-CM | POA: Diagnosis not present

## 2016-10-01 DIAGNOSIS — J449 Chronic obstructive pulmonary disease, unspecified: Secondary | ICD-10-CM | POA: Diagnosis not present

## 2016-10-01 DIAGNOSIS — F411 Generalized anxiety disorder: Secondary | ICD-10-CM | POA: Diagnosis not present

## 2016-10-01 DIAGNOSIS — E038 Other specified hypothyroidism: Secondary | ICD-10-CM | POA: Diagnosis not present

## 2016-10-07 DIAGNOSIS — E782 Mixed hyperlipidemia: Secondary | ICD-10-CM | POA: Diagnosis not present

## 2016-10-07 DIAGNOSIS — D518 Other vitamin B12 deficiency anemias: Secondary | ICD-10-CM | POA: Diagnosis not present

## 2016-10-07 DIAGNOSIS — E119 Type 2 diabetes mellitus without complications: Secondary | ICD-10-CM | POA: Diagnosis not present

## 2016-10-07 DIAGNOSIS — Z79899 Other long term (current) drug therapy: Secondary | ICD-10-CM | POA: Diagnosis not present

## 2016-10-16 DIAGNOSIS — F319 Bipolar disorder, unspecified: Secondary | ICD-10-CM | POA: Diagnosis not present

## 2016-10-23 DIAGNOSIS — M170 Bilateral primary osteoarthritis of knee: Secondary | ICD-10-CM | POA: Diagnosis not present

## 2016-10-23 DIAGNOSIS — M25561 Pain in right knee: Secondary | ICD-10-CM | POA: Diagnosis not present

## 2016-10-23 DIAGNOSIS — M25562 Pain in left knee: Secondary | ICD-10-CM | POA: Diagnosis not present

## 2016-10-29 DIAGNOSIS — F411 Generalized anxiety disorder: Secondary | ICD-10-CM | POA: Diagnosis not present

## 2016-10-29 DIAGNOSIS — E038 Other specified hypothyroidism: Secondary | ICD-10-CM | POA: Diagnosis not present

## 2016-10-29 DIAGNOSIS — J449 Chronic obstructive pulmonary disease, unspecified: Secondary | ICD-10-CM | POA: Diagnosis not present

## 2016-10-29 DIAGNOSIS — F319 Bipolar disorder, unspecified: Secondary | ICD-10-CM | POA: Diagnosis not present

## 2016-11-17 DIAGNOSIS — F319 Bipolar disorder, unspecified: Secondary | ICD-10-CM | POA: Diagnosis not present

## 2016-11-17 DIAGNOSIS — F411 Generalized anxiety disorder: Secondary | ICD-10-CM | POA: Diagnosis not present

## 2016-11-24 DIAGNOSIS — J441 Chronic obstructive pulmonary disease with (acute) exacerbation: Secondary | ICD-10-CM | POA: Diagnosis not present

## 2016-11-24 DIAGNOSIS — J449 Chronic obstructive pulmonary disease, unspecified: Secondary | ICD-10-CM | POA: Diagnosis not present

## 2016-11-24 DIAGNOSIS — G20 Parkinson's disease: Secondary | ICD-10-CM | POA: Diagnosis not present

## 2016-11-24 DIAGNOSIS — R1310 Dysphagia, unspecified: Secondary | ICD-10-CM | POA: Diagnosis not present

## 2016-11-25 DIAGNOSIS — J441 Chronic obstructive pulmonary disease with (acute) exacerbation: Secondary | ICD-10-CM | POA: Diagnosis not present

## 2016-11-25 DIAGNOSIS — J449 Chronic obstructive pulmonary disease, unspecified: Secondary | ICD-10-CM | POA: Diagnosis not present

## 2016-11-25 DIAGNOSIS — R1310 Dysphagia, unspecified: Secondary | ICD-10-CM | POA: Diagnosis not present

## 2016-11-25 DIAGNOSIS — G20 Parkinson's disease: Secondary | ICD-10-CM | POA: Diagnosis not present

## 2016-11-26 DIAGNOSIS — E781 Pure hyperglyceridemia: Secondary | ICD-10-CM | POA: Diagnosis not present

## 2016-11-26 DIAGNOSIS — E038 Other specified hypothyroidism: Secondary | ICD-10-CM | POA: Diagnosis not present

## 2016-11-26 DIAGNOSIS — F411 Generalized anxiety disorder: Secondary | ICD-10-CM | POA: Diagnosis not present

## 2016-11-26 DIAGNOSIS — J449 Chronic obstructive pulmonary disease, unspecified: Secondary | ICD-10-CM | POA: Diagnosis not present

## 2016-11-27 DIAGNOSIS — J441 Chronic obstructive pulmonary disease with (acute) exacerbation: Secondary | ICD-10-CM | POA: Diagnosis not present

## 2016-11-27 DIAGNOSIS — J449 Chronic obstructive pulmonary disease, unspecified: Secondary | ICD-10-CM | POA: Diagnosis not present

## 2016-11-27 DIAGNOSIS — G20 Parkinson's disease: Secondary | ICD-10-CM | POA: Diagnosis not present

## 2016-11-27 DIAGNOSIS — R1310 Dysphagia, unspecified: Secondary | ICD-10-CM | POA: Diagnosis not present

## 2016-11-30 DIAGNOSIS — G20 Parkinson's disease: Secondary | ICD-10-CM | POA: Diagnosis not present

## 2016-11-30 DIAGNOSIS — J449 Chronic obstructive pulmonary disease, unspecified: Secondary | ICD-10-CM | POA: Diagnosis not present

## 2016-11-30 DIAGNOSIS — J441 Chronic obstructive pulmonary disease with (acute) exacerbation: Secondary | ICD-10-CM | POA: Diagnosis not present

## 2016-11-30 DIAGNOSIS — R1310 Dysphagia, unspecified: Secondary | ICD-10-CM | POA: Diagnosis not present

## 2016-12-01 DIAGNOSIS — G20 Parkinson's disease: Secondary | ICD-10-CM | POA: Diagnosis not present

## 2016-12-01 DIAGNOSIS — J441 Chronic obstructive pulmonary disease with (acute) exacerbation: Secondary | ICD-10-CM | POA: Diagnosis not present

## 2016-12-01 DIAGNOSIS — R1310 Dysphagia, unspecified: Secondary | ICD-10-CM | POA: Diagnosis not present

## 2016-12-01 DIAGNOSIS — J449 Chronic obstructive pulmonary disease, unspecified: Secondary | ICD-10-CM | POA: Diagnosis not present

## 2016-12-03 DIAGNOSIS — J441 Chronic obstructive pulmonary disease with (acute) exacerbation: Secondary | ICD-10-CM | POA: Diagnosis not present

## 2016-12-03 DIAGNOSIS — J449 Chronic obstructive pulmonary disease, unspecified: Secondary | ICD-10-CM | POA: Diagnosis not present

## 2016-12-03 DIAGNOSIS — R1310 Dysphagia, unspecified: Secondary | ICD-10-CM | POA: Diagnosis not present

## 2016-12-03 DIAGNOSIS — G20 Parkinson's disease: Secondary | ICD-10-CM | POA: Diagnosis not present

## 2016-12-06 DIAGNOSIS — R1310 Dysphagia, unspecified: Secondary | ICD-10-CM | POA: Diagnosis not present

## 2016-12-06 DIAGNOSIS — G20 Parkinson's disease: Secondary | ICD-10-CM | POA: Diagnosis not present

## 2016-12-06 DIAGNOSIS — J441 Chronic obstructive pulmonary disease with (acute) exacerbation: Secondary | ICD-10-CM | POA: Diagnosis not present

## 2016-12-06 DIAGNOSIS — J449 Chronic obstructive pulmonary disease, unspecified: Secondary | ICD-10-CM | POA: Diagnosis not present

## 2016-12-09 DIAGNOSIS — G20 Parkinson's disease: Secondary | ICD-10-CM | POA: Diagnosis not present

## 2016-12-09 DIAGNOSIS — J441 Chronic obstructive pulmonary disease with (acute) exacerbation: Secondary | ICD-10-CM | POA: Diagnosis not present

## 2016-12-09 DIAGNOSIS — R1310 Dysphagia, unspecified: Secondary | ICD-10-CM | POA: Diagnosis not present

## 2016-12-09 DIAGNOSIS — J449 Chronic obstructive pulmonary disease, unspecified: Secondary | ICD-10-CM | POA: Diagnosis not present

## 2016-12-10 DIAGNOSIS — G20 Parkinson's disease: Secondary | ICD-10-CM | POA: Diagnosis not present

## 2016-12-10 DIAGNOSIS — R1310 Dysphagia, unspecified: Secondary | ICD-10-CM | POA: Diagnosis not present

## 2016-12-10 DIAGNOSIS — J449 Chronic obstructive pulmonary disease, unspecified: Secondary | ICD-10-CM | POA: Diagnosis not present

## 2016-12-10 DIAGNOSIS — J441 Chronic obstructive pulmonary disease with (acute) exacerbation: Secondary | ICD-10-CM | POA: Diagnosis not present

## 2016-12-11 DIAGNOSIS — G20 Parkinson's disease: Secondary | ICD-10-CM | POA: Diagnosis not present

## 2016-12-11 DIAGNOSIS — G250 Essential tremor: Secondary | ICD-10-CM | POA: Diagnosis not present

## 2016-12-11 DIAGNOSIS — F319 Bipolar disorder, unspecified: Secondary | ICD-10-CM | POA: Diagnosis not present

## 2016-12-12 DIAGNOSIS — F411 Generalized anxiety disorder: Secondary | ICD-10-CM | POA: Diagnosis not present

## 2016-12-12 DIAGNOSIS — J449 Chronic obstructive pulmonary disease, unspecified: Secondary | ICD-10-CM | POA: Diagnosis not present

## 2016-12-12 DIAGNOSIS — J441 Chronic obstructive pulmonary disease with (acute) exacerbation: Secondary | ICD-10-CM | POA: Diagnosis not present

## 2016-12-12 DIAGNOSIS — G20 Parkinson's disease: Secondary | ICD-10-CM | POA: Diagnosis not present

## 2016-12-12 DIAGNOSIS — R1310 Dysphagia, unspecified: Secondary | ICD-10-CM | POA: Diagnosis not present

## 2017-01-08 DIAGNOSIS — G20 Parkinson's disease: Secondary | ICD-10-CM | POA: Diagnosis not present

## 2017-01-08 DIAGNOSIS — J449 Chronic obstructive pulmonary disease, unspecified: Secondary | ICD-10-CM | POA: Diagnosis not present

## 2017-01-08 DIAGNOSIS — N3946 Mixed incontinence: Secondary | ICD-10-CM | POA: Diagnosis not present

## 2017-01-08 DIAGNOSIS — E038 Other specified hypothyroidism: Secondary | ICD-10-CM | POA: Diagnosis not present

## 2017-01-21 DIAGNOSIS — R609 Edema, unspecified: Secondary | ICD-10-CM | POA: Diagnosis not present

## 2017-01-27 DIAGNOSIS — Z23 Encounter for immunization: Secondary | ICD-10-CM | POA: Diagnosis not present

## 2017-01-29 DIAGNOSIS — M170 Bilateral primary osteoarthritis of knee: Secondary | ICD-10-CM | POA: Diagnosis not present

## 2017-02-04 DIAGNOSIS — G20 Parkinson's disease: Secondary | ICD-10-CM | POA: Diagnosis not present

## 2017-02-04 DIAGNOSIS — J449 Chronic obstructive pulmonary disease, unspecified: Secondary | ICD-10-CM | POA: Diagnosis not present

## 2017-02-04 DIAGNOSIS — E038 Other specified hypothyroidism: Secondary | ICD-10-CM | POA: Diagnosis not present

## 2017-02-04 DIAGNOSIS — M170 Bilateral primary osteoarthritis of knee: Secondary | ICD-10-CM | POA: Diagnosis not present

## 2017-02-07 DIAGNOSIS — B351 Tinea unguium: Secondary | ICD-10-CM | POA: Diagnosis not present

## 2017-02-07 DIAGNOSIS — M79675 Pain in left toe(s): Secondary | ICD-10-CM | POA: Diagnosis not present

## 2017-02-07 DIAGNOSIS — M2042 Other hammer toe(s) (acquired), left foot: Secondary | ICD-10-CM | POA: Diagnosis not present

## 2017-02-09 DIAGNOSIS — F411 Generalized anxiety disorder: Secondary | ICD-10-CM | POA: Diagnosis not present

## 2017-02-09 DIAGNOSIS — F319 Bipolar disorder, unspecified: Secondary | ICD-10-CM | POA: Diagnosis not present

## 2017-02-11 DIAGNOSIS — J449 Chronic obstructive pulmonary disease, unspecified: Secondary | ICD-10-CM | POA: Diagnosis not present

## 2017-03-05 DIAGNOSIS — M170 Bilateral primary osteoarthritis of knee: Secondary | ICD-10-CM | POA: Diagnosis not present

## 2017-03-05 DIAGNOSIS — G20 Parkinson's disease: Secondary | ICD-10-CM | POA: Diagnosis not present

## 2017-03-05 DIAGNOSIS — J449 Chronic obstructive pulmonary disease, unspecified: Secondary | ICD-10-CM | POA: Diagnosis not present

## 2017-03-05 DIAGNOSIS — F411 Generalized anxiety disorder: Secondary | ICD-10-CM | POA: Diagnosis not present

## 2017-03-23 DIAGNOSIS — E559 Vitamin D deficiency, unspecified: Secondary | ICD-10-CM | POA: Diagnosis not present

## 2017-03-23 DIAGNOSIS — E038 Other specified hypothyroidism: Secondary | ICD-10-CM | POA: Diagnosis not present

## 2017-03-23 DIAGNOSIS — E7849 Other hyperlipidemia: Secondary | ICD-10-CM | POA: Diagnosis not present

## 2017-03-23 DIAGNOSIS — D518 Other vitamin B12 deficiency anemias: Secondary | ICD-10-CM | POA: Diagnosis not present

## 2017-03-23 DIAGNOSIS — Z79899 Other long term (current) drug therapy: Secondary | ICD-10-CM | POA: Diagnosis not present

## 2017-03-23 DIAGNOSIS — E119 Type 2 diabetes mellitus without complications: Secondary | ICD-10-CM | POA: Diagnosis not present

## 2017-03-27 DIAGNOSIS — M170 Bilateral primary osteoarthritis of knee: Secondary | ICD-10-CM | POA: Diagnosis not present

## 2017-03-27 DIAGNOSIS — G20 Parkinson's disease: Secondary | ICD-10-CM | POA: Diagnosis not present

## 2017-03-27 DIAGNOSIS — J449 Chronic obstructive pulmonary disease, unspecified: Secondary | ICD-10-CM | POA: Diagnosis not present

## 2017-03-27 DIAGNOSIS — E038 Other specified hypothyroidism: Secondary | ICD-10-CM | POA: Diagnosis not present

## 2017-04-05 DIAGNOSIS — R918 Other nonspecific abnormal finding of lung field: Secondary | ICD-10-CM | POA: Diagnosis not present

## 2017-04-06 DIAGNOSIS — F319 Bipolar disorder, unspecified: Secondary | ICD-10-CM | POA: Diagnosis not present

## 2017-04-06 DIAGNOSIS — F411 Generalized anxiety disorder: Secondary | ICD-10-CM | POA: Diagnosis not present

## 2017-04-15 DIAGNOSIS — G20 Parkinson's disease: Secondary | ICD-10-CM | POA: Diagnosis not present

## 2017-04-15 DIAGNOSIS — J449 Chronic obstructive pulmonary disease, unspecified: Secondary | ICD-10-CM | POA: Diagnosis not present

## 2017-04-15 DIAGNOSIS — J441 Chronic obstructive pulmonary disease with (acute) exacerbation: Secondary | ICD-10-CM | POA: Diagnosis not present

## 2017-04-15 DIAGNOSIS — R1310 Dysphagia, unspecified: Secondary | ICD-10-CM | POA: Diagnosis not present

## 2017-04-16 DIAGNOSIS — G20 Parkinson's disease: Secondary | ICD-10-CM | POA: Diagnosis not present

## 2017-04-16 DIAGNOSIS — R1310 Dysphagia, unspecified: Secondary | ICD-10-CM | POA: Diagnosis not present

## 2017-04-16 DIAGNOSIS — J449 Chronic obstructive pulmonary disease, unspecified: Secondary | ICD-10-CM | POA: Diagnosis not present

## 2017-04-16 DIAGNOSIS — J441 Chronic obstructive pulmonary disease with (acute) exacerbation: Secondary | ICD-10-CM | POA: Diagnosis not present

## 2017-04-20 DIAGNOSIS — R1310 Dysphagia, unspecified: Secondary | ICD-10-CM | POA: Diagnosis not present

## 2017-04-20 DIAGNOSIS — G20 Parkinson's disease: Secondary | ICD-10-CM | POA: Diagnosis not present

## 2017-04-20 DIAGNOSIS — J449 Chronic obstructive pulmonary disease, unspecified: Secondary | ICD-10-CM | POA: Diagnosis not present

## 2017-04-20 DIAGNOSIS — J441 Chronic obstructive pulmonary disease with (acute) exacerbation: Secondary | ICD-10-CM | POA: Diagnosis not present

## 2017-04-21 DIAGNOSIS — R1310 Dysphagia, unspecified: Secondary | ICD-10-CM | POA: Diagnosis not present

## 2017-04-21 DIAGNOSIS — J441 Chronic obstructive pulmonary disease with (acute) exacerbation: Secondary | ICD-10-CM | POA: Diagnosis not present

## 2017-04-21 DIAGNOSIS — G20 Parkinson's disease: Secondary | ICD-10-CM | POA: Diagnosis not present

## 2017-04-21 DIAGNOSIS — J449 Chronic obstructive pulmonary disease, unspecified: Secondary | ICD-10-CM | POA: Diagnosis not present

## 2017-04-22 DIAGNOSIS — R1310 Dysphagia, unspecified: Secondary | ICD-10-CM | POA: Diagnosis not present

## 2017-04-22 DIAGNOSIS — J449 Chronic obstructive pulmonary disease, unspecified: Secondary | ICD-10-CM | POA: Diagnosis not present

## 2017-04-22 DIAGNOSIS — G20 Parkinson's disease: Secondary | ICD-10-CM | POA: Diagnosis not present

## 2017-04-22 DIAGNOSIS — J441 Chronic obstructive pulmonary disease with (acute) exacerbation: Secondary | ICD-10-CM | POA: Diagnosis not present

## 2017-04-23 DIAGNOSIS — E038 Other specified hypothyroidism: Secondary | ICD-10-CM | POA: Diagnosis not present

## 2017-04-23 DIAGNOSIS — J441 Chronic obstructive pulmonary disease with (acute) exacerbation: Secondary | ICD-10-CM | POA: Diagnosis not present

## 2017-04-23 DIAGNOSIS — J449 Chronic obstructive pulmonary disease, unspecified: Secondary | ICD-10-CM | POA: Diagnosis not present

## 2017-04-23 DIAGNOSIS — M170 Bilateral primary osteoarthritis of knee: Secondary | ICD-10-CM | POA: Diagnosis not present

## 2017-04-23 DIAGNOSIS — R1310 Dysphagia, unspecified: Secondary | ICD-10-CM | POA: Diagnosis not present

## 2017-04-23 DIAGNOSIS — G20 Parkinson's disease: Secondary | ICD-10-CM | POA: Diagnosis not present

## 2017-04-24 DIAGNOSIS — G20 Parkinson's disease: Secondary | ICD-10-CM | POA: Diagnosis not present

## 2017-04-24 DIAGNOSIS — J449 Chronic obstructive pulmonary disease, unspecified: Secondary | ICD-10-CM | POA: Diagnosis not present

## 2017-04-24 DIAGNOSIS — J441 Chronic obstructive pulmonary disease with (acute) exacerbation: Secondary | ICD-10-CM | POA: Diagnosis not present

## 2017-04-24 DIAGNOSIS — R1310 Dysphagia, unspecified: Secondary | ICD-10-CM | POA: Diagnosis not present

## 2017-04-27 DIAGNOSIS — R1310 Dysphagia, unspecified: Secondary | ICD-10-CM | POA: Diagnosis not present

## 2017-04-27 DIAGNOSIS — G20 Parkinson's disease: Secondary | ICD-10-CM | POA: Diagnosis not present

## 2017-04-27 DIAGNOSIS — J449 Chronic obstructive pulmonary disease, unspecified: Secondary | ICD-10-CM | POA: Diagnosis not present

## 2017-04-27 DIAGNOSIS — J441 Chronic obstructive pulmonary disease with (acute) exacerbation: Secondary | ICD-10-CM | POA: Diagnosis not present

## 2017-04-28 DIAGNOSIS — G20 Parkinson's disease: Secondary | ICD-10-CM | POA: Diagnosis not present

## 2017-04-28 DIAGNOSIS — J449 Chronic obstructive pulmonary disease, unspecified: Secondary | ICD-10-CM | POA: Diagnosis not present

## 2017-04-28 DIAGNOSIS — F411 Generalized anxiety disorder: Secondary | ICD-10-CM | POA: Diagnosis not present

## 2017-04-28 DIAGNOSIS — J441 Chronic obstructive pulmonary disease with (acute) exacerbation: Secondary | ICD-10-CM | POA: Diagnosis not present

## 2017-04-28 DIAGNOSIS — F319 Bipolar disorder, unspecified: Secondary | ICD-10-CM | POA: Diagnosis not present

## 2017-04-28 DIAGNOSIS — R1310 Dysphagia, unspecified: Secondary | ICD-10-CM | POA: Diagnosis not present

## 2017-04-29 DIAGNOSIS — G629 Polyneuropathy, unspecified: Secondary | ICD-10-CM | POA: Diagnosis not present

## 2017-04-29 DIAGNOSIS — J449 Chronic obstructive pulmonary disease, unspecified: Secondary | ICD-10-CM | POA: Diagnosis not present

## 2017-04-29 DIAGNOSIS — G250 Essential tremor: Secondary | ICD-10-CM | POA: Diagnosis not present

## 2017-04-29 DIAGNOSIS — R1310 Dysphagia, unspecified: Secondary | ICD-10-CM | POA: Diagnosis not present

## 2017-04-29 DIAGNOSIS — G20 Parkinson's disease: Secondary | ICD-10-CM | POA: Diagnosis not present

## 2017-04-29 DIAGNOSIS — R531 Weakness: Secondary | ICD-10-CM | POA: Diagnosis not present

## 2017-04-29 DIAGNOSIS — J441 Chronic obstructive pulmonary disease with (acute) exacerbation: Secondary | ICD-10-CM | POA: Diagnosis not present

## 2017-04-30 DIAGNOSIS — J441 Chronic obstructive pulmonary disease with (acute) exacerbation: Secondary | ICD-10-CM | POA: Diagnosis not present

## 2017-04-30 DIAGNOSIS — G20 Parkinson's disease: Secondary | ICD-10-CM | POA: Diagnosis not present

## 2017-04-30 DIAGNOSIS — R1310 Dysphagia, unspecified: Secondary | ICD-10-CM | POA: Diagnosis not present

## 2017-04-30 DIAGNOSIS — J449 Chronic obstructive pulmonary disease, unspecified: Secondary | ICD-10-CM | POA: Diagnosis not present

## 2017-05-01 DIAGNOSIS — G4700 Insomnia, unspecified: Secondary | ICD-10-CM | POA: Diagnosis not present

## 2017-05-01 DIAGNOSIS — M170 Bilateral primary osteoarthritis of knee: Secondary | ICD-10-CM | POA: Diagnosis not present

## 2017-05-04 DIAGNOSIS — J449 Chronic obstructive pulmonary disease, unspecified: Secondary | ICD-10-CM | POA: Diagnosis not present

## 2017-05-04 DIAGNOSIS — G20 Parkinson's disease: Secondary | ICD-10-CM | POA: Diagnosis not present

## 2017-05-04 DIAGNOSIS — G4700 Insomnia, unspecified: Secondary | ICD-10-CM | POA: Diagnosis not present

## 2017-05-04 DIAGNOSIS — R1310 Dysphagia, unspecified: Secondary | ICD-10-CM | POA: Diagnosis not present

## 2017-05-04 DIAGNOSIS — F319 Bipolar disorder, unspecified: Secondary | ICD-10-CM | POA: Diagnosis not present

## 2017-05-04 DIAGNOSIS — J441 Chronic obstructive pulmonary disease with (acute) exacerbation: Secondary | ICD-10-CM | POA: Diagnosis not present

## 2017-05-05 DIAGNOSIS — J449 Chronic obstructive pulmonary disease, unspecified: Secondary | ICD-10-CM | POA: Diagnosis not present

## 2017-05-05 DIAGNOSIS — R1310 Dysphagia, unspecified: Secondary | ICD-10-CM | POA: Diagnosis not present

## 2017-05-05 DIAGNOSIS — J441 Chronic obstructive pulmonary disease with (acute) exacerbation: Secondary | ICD-10-CM | POA: Diagnosis not present

## 2017-05-05 DIAGNOSIS — G20 Parkinson's disease: Secondary | ICD-10-CM | POA: Diagnosis not present

## 2017-05-06 DIAGNOSIS — R1310 Dysphagia, unspecified: Secondary | ICD-10-CM | POA: Diagnosis not present

## 2017-05-06 DIAGNOSIS — J449 Chronic obstructive pulmonary disease, unspecified: Secondary | ICD-10-CM | POA: Diagnosis not present

## 2017-05-06 DIAGNOSIS — G20 Parkinson's disease: Secondary | ICD-10-CM | POA: Diagnosis not present

## 2017-05-06 DIAGNOSIS — J441 Chronic obstructive pulmonary disease with (acute) exacerbation: Secondary | ICD-10-CM | POA: Diagnosis not present

## 2017-05-07 DIAGNOSIS — G20 Parkinson's disease: Secondary | ICD-10-CM | POA: Diagnosis not present

## 2017-05-07 DIAGNOSIS — J441 Chronic obstructive pulmonary disease with (acute) exacerbation: Secondary | ICD-10-CM | POA: Diagnosis not present

## 2017-05-07 DIAGNOSIS — R1310 Dysphagia, unspecified: Secondary | ICD-10-CM | POA: Diagnosis not present

## 2017-05-07 DIAGNOSIS — J449 Chronic obstructive pulmonary disease, unspecified: Secondary | ICD-10-CM | POA: Diagnosis not present

## 2017-05-08 DIAGNOSIS — J449 Chronic obstructive pulmonary disease, unspecified: Secondary | ICD-10-CM | POA: Diagnosis not present

## 2017-05-08 DIAGNOSIS — G20 Parkinson's disease: Secondary | ICD-10-CM | POA: Diagnosis not present

## 2017-05-08 DIAGNOSIS — J441 Chronic obstructive pulmonary disease with (acute) exacerbation: Secondary | ICD-10-CM | POA: Diagnosis not present

## 2017-05-08 DIAGNOSIS — R1310 Dysphagia, unspecified: Secondary | ICD-10-CM | POA: Diagnosis not present

## 2017-05-11 DIAGNOSIS — J449 Chronic obstructive pulmonary disease, unspecified: Secondary | ICD-10-CM | POA: Diagnosis not present

## 2017-05-11 DIAGNOSIS — G20 Parkinson's disease: Secondary | ICD-10-CM | POA: Diagnosis not present

## 2017-05-11 DIAGNOSIS — R1310 Dysphagia, unspecified: Secondary | ICD-10-CM | POA: Diagnosis not present

## 2017-05-11 DIAGNOSIS — J441 Chronic obstructive pulmonary disease with (acute) exacerbation: Secondary | ICD-10-CM | POA: Diagnosis not present

## 2017-05-12 DIAGNOSIS — G20 Parkinson's disease: Secondary | ICD-10-CM | POA: Diagnosis not present

## 2017-05-12 DIAGNOSIS — J441 Chronic obstructive pulmonary disease with (acute) exacerbation: Secondary | ICD-10-CM | POA: Diagnosis not present

## 2017-05-12 DIAGNOSIS — J449 Chronic obstructive pulmonary disease, unspecified: Secondary | ICD-10-CM | POA: Diagnosis not present

## 2017-05-12 DIAGNOSIS — R1310 Dysphagia, unspecified: Secondary | ICD-10-CM | POA: Diagnosis not present

## 2017-05-13 DIAGNOSIS — R1310 Dysphagia, unspecified: Secondary | ICD-10-CM | POA: Diagnosis not present

## 2017-05-13 DIAGNOSIS — J449 Chronic obstructive pulmonary disease, unspecified: Secondary | ICD-10-CM | POA: Diagnosis not present

## 2017-05-13 DIAGNOSIS — G20 Parkinson's disease: Secondary | ICD-10-CM | POA: Diagnosis not present

## 2017-05-13 DIAGNOSIS — J441 Chronic obstructive pulmonary disease with (acute) exacerbation: Secondary | ICD-10-CM | POA: Diagnosis not present

## 2017-05-14 DIAGNOSIS — G20 Parkinson's disease: Secondary | ICD-10-CM | POA: Diagnosis not present

## 2017-05-14 DIAGNOSIS — J441 Chronic obstructive pulmonary disease with (acute) exacerbation: Secondary | ICD-10-CM | POA: Diagnosis not present

## 2017-05-14 DIAGNOSIS — J449 Chronic obstructive pulmonary disease, unspecified: Secondary | ICD-10-CM | POA: Diagnosis not present

## 2017-05-14 DIAGNOSIS — R1310 Dysphagia, unspecified: Secondary | ICD-10-CM | POA: Diagnosis not present

## 2017-05-15 DIAGNOSIS — G20 Parkinson's disease: Secondary | ICD-10-CM | POA: Diagnosis not present

## 2017-05-15 DIAGNOSIS — R1310 Dysphagia, unspecified: Secondary | ICD-10-CM | POA: Diagnosis not present

## 2017-05-15 DIAGNOSIS — J449 Chronic obstructive pulmonary disease, unspecified: Secondary | ICD-10-CM | POA: Diagnosis not present

## 2017-05-15 DIAGNOSIS — J441 Chronic obstructive pulmonary disease with (acute) exacerbation: Secondary | ICD-10-CM | POA: Diagnosis not present

## 2017-05-18 DIAGNOSIS — J449 Chronic obstructive pulmonary disease, unspecified: Secondary | ICD-10-CM | POA: Diagnosis not present

## 2017-05-18 DIAGNOSIS — J441 Chronic obstructive pulmonary disease with (acute) exacerbation: Secondary | ICD-10-CM | POA: Diagnosis not present

## 2017-05-18 DIAGNOSIS — R1310 Dysphagia, unspecified: Secondary | ICD-10-CM | POA: Diagnosis not present

## 2017-05-18 DIAGNOSIS — G20 Parkinson's disease: Secondary | ICD-10-CM | POA: Diagnosis not present

## 2017-05-19 DIAGNOSIS — J441 Chronic obstructive pulmonary disease with (acute) exacerbation: Secondary | ICD-10-CM | POA: Diagnosis not present

## 2017-05-19 DIAGNOSIS — R1310 Dysphagia, unspecified: Secondary | ICD-10-CM | POA: Diagnosis not present

## 2017-05-19 DIAGNOSIS — J449 Chronic obstructive pulmonary disease, unspecified: Secondary | ICD-10-CM | POA: Diagnosis not present

## 2017-05-19 DIAGNOSIS — G20 Parkinson's disease: Secondary | ICD-10-CM | POA: Diagnosis not present

## 2017-05-21 DIAGNOSIS — M170 Bilateral primary osteoarthritis of knee: Secondary | ICD-10-CM | POA: Diagnosis not present

## 2017-05-21 DIAGNOSIS — R1310 Dysphagia, unspecified: Secondary | ICD-10-CM | POA: Diagnosis not present

## 2017-05-21 DIAGNOSIS — J449 Chronic obstructive pulmonary disease, unspecified: Secondary | ICD-10-CM | POA: Diagnosis not present

## 2017-05-21 DIAGNOSIS — J441 Chronic obstructive pulmonary disease with (acute) exacerbation: Secondary | ICD-10-CM | POA: Diagnosis not present

## 2017-05-21 DIAGNOSIS — E038 Other specified hypothyroidism: Secondary | ICD-10-CM | POA: Diagnosis not present

## 2017-05-21 DIAGNOSIS — G20 Parkinson's disease: Secondary | ICD-10-CM | POA: Diagnosis not present

## 2017-05-26 DIAGNOSIS — J449 Chronic obstructive pulmonary disease, unspecified: Secondary | ICD-10-CM | POA: Diagnosis not present

## 2017-05-26 DIAGNOSIS — R1310 Dysphagia, unspecified: Secondary | ICD-10-CM | POA: Diagnosis not present

## 2017-05-26 DIAGNOSIS — G20 Parkinson's disease: Secondary | ICD-10-CM | POA: Diagnosis not present

## 2017-05-26 DIAGNOSIS — J441 Chronic obstructive pulmonary disease with (acute) exacerbation: Secondary | ICD-10-CM | POA: Diagnosis not present

## 2017-05-27 DIAGNOSIS — J449 Chronic obstructive pulmonary disease, unspecified: Secondary | ICD-10-CM | POA: Diagnosis not present

## 2017-05-27 DIAGNOSIS — G20 Parkinson's disease: Secondary | ICD-10-CM | POA: Diagnosis not present

## 2017-05-27 DIAGNOSIS — J441 Chronic obstructive pulmonary disease with (acute) exacerbation: Secondary | ICD-10-CM | POA: Diagnosis not present

## 2017-05-27 DIAGNOSIS — R1310 Dysphagia, unspecified: Secondary | ICD-10-CM | POA: Diagnosis not present

## 2017-05-28 DIAGNOSIS — J449 Chronic obstructive pulmonary disease, unspecified: Secondary | ICD-10-CM | POA: Diagnosis not present

## 2017-05-28 DIAGNOSIS — J441 Chronic obstructive pulmonary disease with (acute) exacerbation: Secondary | ICD-10-CM | POA: Diagnosis not present

## 2017-05-28 DIAGNOSIS — R1310 Dysphagia, unspecified: Secondary | ICD-10-CM | POA: Diagnosis not present

## 2017-05-28 DIAGNOSIS — G20 Parkinson's disease: Secondary | ICD-10-CM | POA: Diagnosis not present

## 2017-05-29 DIAGNOSIS — J441 Chronic obstructive pulmonary disease with (acute) exacerbation: Secondary | ICD-10-CM | POA: Diagnosis not present

## 2017-05-29 DIAGNOSIS — R1310 Dysphagia, unspecified: Secondary | ICD-10-CM | POA: Diagnosis not present

## 2017-05-29 DIAGNOSIS — G20 Parkinson's disease: Secondary | ICD-10-CM | POA: Diagnosis not present

## 2017-05-29 DIAGNOSIS — J449 Chronic obstructive pulmonary disease, unspecified: Secondary | ICD-10-CM | POA: Diagnosis not present

## 2017-06-03 DIAGNOSIS — F319 Bipolar disorder, unspecified: Secondary | ICD-10-CM | POA: Diagnosis not present

## 2017-06-03 DIAGNOSIS — Z658 Other specified problems related to psychosocial circumstances: Secondary | ICD-10-CM | POA: Diagnosis not present

## 2017-06-09 DIAGNOSIS — I739 Peripheral vascular disease, unspecified: Secondary | ICD-10-CM | POA: Diagnosis not present

## 2017-06-09 DIAGNOSIS — B351 Tinea unguium: Secondary | ICD-10-CM | POA: Diagnosis not present

## 2017-06-09 DIAGNOSIS — L84 Corns and callosities: Secondary | ICD-10-CM | POA: Diagnosis not present

## 2017-06-09 DIAGNOSIS — M79675 Pain in left toe(s): Secondary | ICD-10-CM | POA: Diagnosis not present

## 2017-06-11 DIAGNOSIS — R21 Rash and other nonspecific skin eruption: Secondary | ICD-10-CM | POA: Diagnosis not present

## 2017-06-18 DIAGNOSIS — E038 Other specified hypothyroidism: Secondary | ICD-10-CM | POA: Diagnosis not present

## 2017-06-18 DIAGNOSIS — M170 Bilateral primary osteoarthritis of knee: Secondary | ICD-10-CM | POA: Diagnosis not present

## 2017-06-18 DIAGNOSIS — G20 Parkinson's disease: Secondary | ICD-10-CM | POA: Diagnosis not present

## 2017-06-18 DIAGNOSIS — J449 Chronic obstructive pulmonary disease, unspecified: Secondary | ICD-10-CM | POA: Diagnosis not present

## 2017-06-29 DIAGNOSIS — S81801A Unspecified open wound, right lower leg, initial encounter: Secondary | ICD-10-CM | POA: Diagnosis not present

## 2017-07-09 DIAGNOSIS — G20 Parkinson's disease: Secondary | ICD-10-CM | POA: Diagnosis not present

## 2017-07-09 DIAGNOSIS — M170 Bilateral primary osteoarthritis of knee: Secondary | ICD-10-CM | POA: Diagnosis not present

## 2017-07-09 DIAGNOSIS — I739 Peripheral vascular disease, unspecified: Secondary | ICD-10-CM | POA: Diagnosis not present

## 2017-07-09 DIAGNOSIS — J449 Chronic obstructive pulmonary disease, unspecified: Secondary | ICD-10-CM | POA: Diagnosis not present

## 2017-07-20 DIAGNOSIS — H6593 Unspecified nonsuppurative otitis media, bilateral: Secondary | ICD-10-CM | POA: Diagnosis not present

## 2017-07-27 DIAGNOSIS — E039 Hypothyroidism, unspecified: Secondary | ICD-10-CM | POA: Diagnosis not present

## 2017-08-05 DIAGNOSIS — M170 Bilateral primary osteoarthritis of knee: Secondary | ICD-10-CM | POA: Diagnosis not present

## 2017-08-05 DIAGNOSIS — J449 Chronic obstructive pulmonary disease, unspecified: Secondary | ICD-10-CM | POA: Diagnosis not present

## 2017-08-05 DIAGNOSIS — E038 Other specified hypothyroidism: Secondary | ICD-10-CM | POA: Diagnosis not present

## 2017-08-05 DIAGNOSIS — G20 Parkinson's disease: Secondary | ICD-10-CM | POA: Diagnosis not present

## 2017-08-10 DIAGNOSIS — N898 Other specified noninflammatory disorders of vagina: Secondary | ICD-10-CM | POA: Diagnosis not present

## 2017-08-18 DIAGNOSIS — F3175 Bipolar disorder, in partial remission, most recent episode depressed: Secondary | ICD-10-CM | POA: Diagnosis not present

## 2017-08-24 DIAGNOSIS — F3175 Bipolar disorder, in partial remission, most recent episode depressed: Secondary | ICD-10-CM | POA: Diagnosis not present

## 2017-09-02 DIAGNOSIS — G20 Parkinson's disease: Secondary | ICD-10-CM | POA: Diagnosis not present

## 2017-09-02 DIAGNOSIS — J449 Chronic obstructive pulmonary disease, unspecified: Secondary | ICD-10-CM | POA: Diagnosis not present

## 2017-09-02 DIAGNOSIS — E038 Other specified hypothyroidism: Secondary | ICD-10-CM | POA: Diagnosis not present

## 2017-09-02 DIAGNOSIS — I739 Peripheral vascular disease, unspecified: Secondary | ICD-10-CM | POA: Diagnosis not present

## 2017-09-08 DIAGNOSIS — F3175 Bipolar disorder, in partial remission, most recent episode depressed: Secondary | ICD-10-CM | POA: Diagnosis not present

## 2017-09-09 DIAGNOSIS — M79622 Pain in left upper arm: Secondary | ICD-10-CM | POA: Diagnosis not present

## 2017-09-10 DIAGNOSIS — M79622 Pain in left upper arm: Secondary | ICD-10-CM | POA: Diagnosis not present

## 2017-09-11 DIAGNOSIS — R293 Abnormal posture: Secondary | ICD-10-CM | POA: Diagnosis not present

## 2017-09-11 DIAGNOSIS — M25512 Pain in left shoulder: Secondary | ICD-10-CM | POA: Diagnosis not present

## 2017-09-11 DIAGNOSIS — J449 Chronic obstructive pulmonary disease, unspecified: Secondary | ICD-10-CM | POA: Diagnosis not present

## 2017-09-14 DIAGNOSIS — M25512 Pain in left shoulder: Secondary | ICD-10-CM | POA: Diagnosis not present

## 2017-09-14 DIAGNOSIS — R293 Abnormal posture: Secondary | ICD-10-CM | POA: Diagnosis not present

## 2017-09-14 DIAGNOSIS — J449 Chronic obstructive pulmonary disease, unspecified: Secondary | ICD-10-CM | POA: Diagnosis not present

## 2017-09-15 DIAGNOSIS — J449 Chronic obstructive pulmonary disease, unspecified: Secondary | ICD-10-CM | POA: Diagnosis not present

## 2017-09-15 DIAGNOSIS — M25512 Pain in left shoulder: Secondary | ICD-10-CM | POA: Diagnosis not present

## 2017-09-15 DIAGNOSIS — F3175 Bipolar disorder, in partial remission, most recent episode depressed: Secondary | ICD-10-CM | POA: Diagnosis not present

## 2017-09-15 DIAGNOSIS — R293 Abnormal posture: Secondary | ICD-10-CM | POA: Diagnosis not present

## 2017-09-16 DIAGNOSIS — M25512 Pain in left shoulder: Secondary | ICD-10-CM | POA: Diagnosis not present

## 2017-09-16 DIAGNOSIS — J449 Chronic obstructive pulmonary disease, unspecified: Secondary | ICD-10-CM | POA: Diagnosis not present

## 2017-09-16 DIAGNOSIS — J0100 Acute maxillary sinusitis, unspecified: Secondary | ICD-10-CM | POA: Diagnosis not present

## 2017-09-16 DIAGNOSIS — R293 Abnormal posture: Secondary | ICD-10-CM | POA: Diagnosis not present

## 2017-09-17 DIAGNOSIS — M25512 Pain in left shoulder: Secondary | ICD-10-CM | POA: Diagnosis not present

## 2017-09-17 DIAGNOSIS — J449 Chronic obstructive pulmonary disease, unspecified: Secondary | ICD-10-CM | POA: Diagnosis not present

## 2017-09-17 DIAGNOSIS — R293 Abnormal posture: Secondary | ICD-10-CM | POA: Diagnosis not present

## 2017-09-18 DIAGNOSIS — J449 Chronic obstructive pulmonary disease, unspecified: Secondary | ICD-10-CM | POA: Diagnosis not present

## 2017-09-18 DIAGNOSIS — R293 Abnormal posture: Secondary | ICD-10-CM | POA: Diagnosis not present

## 2017-09-18 DIAGNOSIS — M25512 Pain in left shoulder: Secondary | ICD-10-CM | POA: Diagnosis not present

## 2017-09-21 DIAGNOSIS — K130 Diseases of lips: Secondary | ICD-10-CM | POA: Diagnosis not present

## 2017-09-21 DIAGNOSIS — J449 Chronic obstructive pulmonary disease, unspecified: Secondary | ICD-10-CM | POA: Diagnosis not present

## 2017-09-21 DIAGNOSIS — F3175 Bipolar disorder, in partial remission, most recent episode depressed: Secondary | ICD-10-CM | POA: Diagnosis not present

## 2017-09-21 DIAGNOSIS — R1310 Dysphagia, unspecified: Secondary | ICD-10-CM | POA: Diagnosis not present

## 2017-09-21 DIAGNOSIS — F064 Anxiety disorder due to known physiological condition: Secondary | ICD-10-CM | POA: Diagnosis not present

## 2017-09-21 DIAGNOSIS — R293 Abnormal posture: Secondary | ICD-10-CM | POA: Diagnosis not present

## 2017-09-21 DIAGNOSIS — M25512 Pain in left shoulder: Secondary | ICD-10-CM | POA: Diagnosis not present

## 2017-09-21 DIAGNOSIS — J441 Chronic obstructive pulmonary disease with (acute) exacerbation: Secondary | ICD-10-CM | POA: Diagnosis not present

## 2017-09-21 DIAGNOSIS — G20 Parkinson's disease: Secondary | ICD-10-CM | POA: Diagnosis not present

## 2017-09-22 DIAGNOSIS — J449 Chronic obstructive pulmonary disease, unspecified: Secondary | ICD-10-CM | POA: Diagnosis not present

## 2017-09-22 DIAGNOSIS — J441 Chronic obstructive pulmonary disease with (acute) exacerbation: Secondary | ICD-10-CM | POA: Diagnosis not present

## 2017-09-22 DIAGNOSIS — R293 Abnormal posture: Secondary | ICD-10-CM | POA: Diagnosis not present

## 2017-09-22 DIAGNOSIS — F3175 Bipolar disorder, in partial remission, most recent episode depressed: Secondary | ICD-10-CM | POA: Diagnosis not present

## 2017-09-22 DIAGNOSIS — R1310 Dysphagia, unspecified: Secondary | ICD-10-CM | POA: Diagnosis not present

## 2017-09-22 DIAGNOSIS — M25512 Pain in left shoulder: Secondary | ICD-10-CM | POA: Diagnosis not present

## 2017-09-22 DIAGNOSIS — G20 Parkinson's disease: Secondary | ICD-10-CM | POA: Diagnosis not present

## 2017-09-24 DIAGNOSIS — J449 Chronic obstructive pulmonary disease, unspecified: Secondary | ICD-10-CM | POA: Diagnosis not present

## 2017-09-24 DIAGNOSIS — R293 Abnormal posture: Secondary | ICD-10-CM | POA: Diagnosis not present

## 2017-09-24 DIAGNOSIS — J441 Chronic obstructive pulmonary disease with (acute) exacerbation: Secondary | ICD-10-CM | POA: Diagnosis not present

## 2017-09-24 DIAGNOSIS — E038 Other specified hypothyroidism: Secondary | ICD-10-CM | POA: Diagnosis not present

## 2017-09-24 DIAGNOSIS — R1310 Dysphagia, unspecified: Secondary | ICD-10-CM | POA: Diagnosis not present

## 2017-09-24 DIAGNOSIS — M25512 Pain in left shoulder: Secondary | ICD-10-CM | POA: Diagnosis not present

## 2017-09-24 DIAGNOSIS — G20 Parkinson's disease: Secondary | ICD-10-CM | POA: Diagnosis not present

## 2017-09-24 DIAGNOSIS — I739 Peripheral vascular disease, unspecified: Secondary | ICD-10-CM | POA: Diagnosis not present

## 2017-09-25 DIAGNOSIS — R1310 Dysphagia, unspecified: Secondary | ICD-10-CM | POA: Diagnosis not present

## 2017-09-25 DIAGNOSIS — J441 Chronic obstructive pulmonary disease with (acute) exacerbation: Secondary | ICD-10-CM | POA: Diagnosis not present

## 2017-09-25 DIAGNOSIS — J449 Chronic obstructive pulmonary disease, unspecified: Secondary | ICD-10-CM | POA: Diagnosis not present

## 2017-09-25 DIAGNOSIS — R293 Abnormal posture: Secondary | ICD-10-CM | POA: Diagnosis not present

## 2017-09-25 DIAGNOSIS — G20 Parkinson's disease: Secondary | ICD-10-CM | POA: Diagnosis not present

## 2017-09-25 DIAGNOSIS — M25512 Pain in left shoulder: Secondary | ICD-10-CM | POA: Diagnosis not present

## 2017-09-26 DIAGNOSIS — R1310 Dysphagia, unspecified: Secondary | ICD-10-CM | POA: Diagnosis not present

## 2017-09-26 DIAGNOSIS — J441 Chronic obstructive pulmonary disease with (acute) exacerbation: Secondary | ICD-10-CM | POA: Diagnosis not present

## 2017-09-26 DIAGNOSIS — G20 Parkinson's disease: Secondary | ICD-10-CM | POA: Diagnosis not present

## 2017-09-26 DIAGNOSIS — J449 Chronic obstructive pulmonary disease, unspecified: Secondary | ICD-10-CM | POA: Diagnosis not present

## 2017-09-26 DIAGNOSIS — M25512 Pain in left shoulder: Secondary | ICD-10-CM | POA: Diagnosis not present

## 2017-09-26 DIAGNOSIS — R293 Abnormal posture: Secondary | ICD-10-CM | POA: Diagnosis not present

## 2017-09-28 DIAGNOSIS — F319 Bipolar disorder, unspecified: Secondary | ICD-10-CM | POA: Diagnosis not present

## 2017-09-28 DIAGNOSIS — J449 Chronic obstructive pulmonary disease, unspecified: Secondary | ICD-10-CM | POA: Diagnosis not present

## 2017-09-28 DIAGNOSIS — R293 Abnormal posture: Secondary | ICD-10-CM | POA: Diagnosis not present

## 2017-09-28 DIAGNOSIS — J441 Chronic obstructive pulmonary disease with (acute) exacerbation: Secondary | ICD-10-CM | POA: Diagnosis not present

## 2017-09-28 DIAGNOSIS — R1310 Dysphagia, unspecified: Secondary | ICD-10-CM | POA: Diagnosis not present

## 2017-09-28 DIAGNOSIS — E038 Other specified hypothyroidism: Secondary | ICD-10-CM | POA: Diagnosis not present

## 2017-09-28 DIAGNOSIS — M25512 Pain in left shoulder: Secondary | ICD-10-CM | POA: Diagnosis not present

## 2017-09-28 DIAGNOSIS — G20 Parkinson's disease: Secondary | ICD-10-CM | POA: Diagnosis not present

## 2017-09-29 DIAGNOSIS — M25512 Pain in left shoulder: Secondary | ICD-10-CM | POA: Diagnosis not present

## 2017-09-29 DIAGNOSIS — Z5181 Encounter for therapeutic drug level monitoring: Secondary | ICD-10-CM | POA: Diagnosis not present

## 2017-09-29 DIAGNOSIS — R1310 Dysphagia, unspecified: Secondary | ICD-10-CM | POA: Diagnosis not present

## 2017-09-29 DIAGNOSIS — E039 Hypothyroidism, unspecified: Secondary | ICD-10-CM | POA: Diagnosis not present

## 2017-09-29 DIAGNOSIS — G20 Parkinson's disease: Secondary | ICD-10-CM | POA: Diagnosis not present

## 2017-09-29 DIAGNOSIS — D649 Anemia, unspecified: Secondary | ICD-10-CM | POA: Diagnosis not present

## 2017-09-29 DIAGNOSIS — F3175 Bipolar disorder, in partial remission, most recent episode depressed: Secondary | ICD-10-CM | POA: Diagnosis not present

## 2017-09-29 DIAGNOSIS — I10 Essential (primary) hypertension: Secondary | ICD-10-CM | POA: Diagnosis not present

## 2017-09-29 DIAGNOSIS — R569 Unspecified convulsions: Secondary | ICD-10-CM | POA: Diagnosis not present

## 2017-09-29 DIAGNOSIS — J449 Chronic obstructive pulmonary disease, unspecified: Secondary | ICD-10-CM | POA: Diagnosis not present

## 2017-09-29 DIAGNOSIS — L84 Corns and callosities: Secondary | ICD-10-CM | POA: Diagnosis not present

## 2017-09-29 DIAGNOSIS — E785 Hyperlipidemia, unspecified: Secondary | ICD-10-CM | POA: Diagnosis not present

## 2017-09-29 DIAGNOSIS — I739 Peripheral vascular disease, unspecified: Secondary | ICD-10-CM | POA: Diagnosis not present

## 2017-09-29 DIAGNOSIS — M79675 Pain in left toe(s): Secondary | ICD-10-CM | POA: Diagnosis not present

## 2017-09-29 DIAGNOSIS — R293 Abnormal posture: Secondary | ICD-10-CM | POA: Diagnosis not present

## 2017-09-29 DIAGNOSIS — B351 Tinea unguium: Secondary | ICD-10-CM | POA: Diagnosis not present

## 2017-09-29 DIAGNOSIS — J441 Chronic obstructive pulmonary disease with (acute) exacerbation: Secondary | ICD-10-CM | POA: Diagnosis not present

## 2017-09-30 DIAGNOSIS — R293 Abnormal posture: Secondary | ICD-10-CM | POA: Diagnosis not present

## 2017-09-30 DIAGNOSIS — J449 Chronic obstructive pulmonary disease, unspecified: Secondary | ICD-10-CM | POA: Diagnosis not present

## 2017-09-30 DIAGNOSIS — J441 Chronic obstructive pulmonary disease with (acute) exacerbation: Secondary | ICD-10-CM | POA: Diagnosis not present

## 2017-09-30 DIAGNOSIS — R1310 Dysphagia, unspecified: Secondary | ICD-10-CM | POA: Diagnosis not present

## 2017-09-30 DIAGNOSIS — M25512 Pain in left shoulder: Secondary | ICD-10-CM | POA: Diagnosis not present

## 2017-09-30 DIAGNOSIS — G20 Parkinson's disease: Secondary | ICD-10-CM | POA: Diagnosis not present

## 2017-10-01 DIAGNOSIS — J449 Chronic obstructive pulmonary disease, unspecified: Secondary | ICD-10-CM | POA: Diagnosis not present

## 2017-10-01 DIAGNOSIS — M25512 Pain in left shoulder: Secondary | ICD-10-CM | POA: Diagnosis not present

## 2017-10-01 DIAGNOSIS — R1310 Dysphagia, unspecified: Secondary | ICD-10-CM | POA: Diagnosis not present

## 2017-10-01 DIAGNOSIS — J441 Chronic obstructive pulmonary disease with (acute) exacerbation: Secondary | ICD-10-CM | POA: Diagnosis not present

## 2017-10-01 DIAGNOSIS — G20 Parkinson's disease: Secondary | ICD-10-CM | POA: Diagnosis not present

## 2017-10-01 DIAGNOSIS — R293 Abnormal posture: Secondary | ICD-10-CM | POA: Diagnosis not present

## 2017-10-02 DIAGNOSIS — M25512 Pain in left shoulder: Secondary | ICD-10-CM | POA: Diagnosis not present

## 2017-10-02 DIAGNOSIS — J449 Chronic obstructive pulmonary disease, unspecified: Secondary | ICD-10-CM | POA: Diagnosis not present

## 2017-10-02 DIAGNOSIS — G20 Parkinson's disease: Secondary | ICD-10-CM | POA: Diagnosis not present

## 2017-10-02 DIAGNOSIS — R293 Abnormal posture: Secondary | ICD-10-CM | POA: Diagnosis not present

## 2017-10-02 DIAGNOSIS — R1310 Dysphagia, unspecified: Secondary | ICD-10-CM | POA: Diagnosis not present

## 2017-10-02 DIAGNOSIS — J441 Chronic obstructive pulmonary disease with (acute) exacerbation: Secondary | ICD-10-CM | POA: Diagnosis not present

## 2017-10-05 DIAGNOSIS — J441 Chronic obstructive pulmonary disease with (acute) exacerbation: Secondary | ICD-10-CM | POA: Diagnosis not present

## 2017-10-05 DIAGNOSIS — G20 Parkinson's disease: Secondary | ICD-10-CM | POA: Diagnosis not present

## 2017-10-05 DIAGNOSIS — F064 Anxiety disorder due to known physiological condition: Secondary | ICD-10-CM | POA: Diagnosis not present

## 2017-10-05 DIAGNOSIS — J449 Chronic obstructive pulmonary disease, unspecified: Secondary | ICD-10-CM | POA: Diagnosis not present

## 2017-10-05 DIAGNOSIS — R293 Abnormal posture: Secondary | ICD-10-CM | POA: Diagnosis not present

## 2017-10-05 DIAGNOSIS — M25512 Pain in left shoulder: Secondary | ICD-10-CM | POA: Diagnosis not present

## 2017-10-05 DIAGNOSIS — R1310 Dysphagia, unspecified: Secondary | ICD-10-CM | POA: Diagnosis not present

## 2017-10-05 DIAGNOSIS — F3175 Bipolar disorder, in partial remission, most recent episode depressed: Secondary | ICD-10-CM | POA: Diagnosis not present

## 2017-10-05 DIAGNOSIS — F5105 Insomnia due to other mental disorder: Secondary | ICD-10-CM | POA: Diagnosis not present

## 2017-10-06 DIAGNOSIS — F3175 Bipolar disorder, in partial remission, most recent episode depressed: Secondary | ICD-10-CM | POA: Diagnosis not present

## 2017-10-06 DIAGNOSIS — R1310 Dysphagia, unspecified: Secondary | ICD-10-CM | POA: Diagnosis not present

## 2017-10-06 DIAGNOSIS — R293 Abnormal posture: Secondary | ICD-10-CM | POA: Diagnosis not present

## 2017-10-06 DIAGNOSIS — J441 Chronic obstructive pulmonary disease with (acute) exacerbation: Secondary | ICD-10-CM | POA: Diagnosis not present

## 2017-10-06 DIAGNOSIS — M25512 Pain in left shoulder: Secondary | ICD-10-CM | POA: Diagnosis not present

## 2017-10-06 DIAGNOSIS — G20 Parkinson's disease: Secondary | ICD-10-CM | POA: Diagnosis not present

## 2017-10-06 DIAGNOSIS — J449 Chronic obstructive pulmonary disease, unspecified: Secondary | ICD-10-CM | POA: Diagnosis not present

## 2017-10-07 DIAGNOSIS — J449 Chronic obstructive pulmonary disease, unspecified: Secondary | ICD-10-CM | POA: Diagnosis not present

## 2017-10-07 DIAGNOSIS — R293 Abnormal posture: Secondary | ICD-10-CM | POA: Diagnosis not present

## 2017-10-07 DIAGNOSIS — M25512 Pain in left shoulder: Secondary | ICD-10-CM | POA: Diagnosis not present

## 2017-10-07 DIAGNOSIS — R1310 Dysphagia, unspecified: Secondary | ICD-10-CM | POA: Diagnosis not present

## 2017-10-07 DIAGNOSIS — J441 Chronic obstructive pulmonary disease with (acute) exacerbation: Secondary | ICD-10-CM | POA: Diagnosis not present

## 2017-10-07 DIAGNOSIS — G20 Parkinson's disease: Secondary | ICD-10-CM | POA: Diagnosis not present

## 2017-10-08 DIAGNOSIS — R293 Abnormal posture: Secondary | ICD-10-CM | POA: Diagnosis not present

## 2017-10-08 DIAGNOSIS — M25512 Pain in left shoulder: Secondary | ICD-10-CM | POA: Diagnosis not present

## 2017-10-08 DIAGNOSIS — J441 Chronic obstructive pulmonary disease with (acute) exacerbation: Secondary | ICD-10-CM | POA: Diagnosis not present

## 2017-10-08 DIAGNOSIS — R1310 Dysphagia, unspecified: Secondary | ICD-10-CM | POA: Diagnosis not present

## 2017-10-08 DIAGNOSIS — J449 Chronic obstructive pulmonary disease, unspecified: Secondary | ICD-10-CM | POA: Diagnosis not present

## 2017-10-08 DIAGNOSIS — G20 Parkinson's disease: Secondary | ICD-10-CM | POA: Diagnosis not present

## 2017-10-13 DIAGNOSIS — F3175 Bipolar disorder, in partial remission, most recent episode depressed: Secondary | ICD-10-CM | POA: Diagnosis not present

## 2017-10-19 DIAGNOSIS — F5105 Insomnia due to other mental disorder: Secondary | ICD-10-CM | POA: Diagnosis not present

## 2017-10-19 DIAGNOSIS — F064 Anxiety disorder due to known physiological condition: Secondary | ICD-10-CM | POA: Diagnosis not present

## 2017-10-19 DIAGNOSIS — F3175 Bipolar disorder, in partial remission, most recent episode depressed: Secondary | ICD-10-CM | POA: Diagnosis not present

## 2017-10-21 DIAGNOSIS — G20 Parkinson's disease: Secondary | ICD-10-CM | POA: Diagnosis not present

## 2017-10-21 DIAGNOSIS — E038 Other specified hypothyroidism: Secondary | ICD-10-CM | POA: Diagnosis not present

## 2017-10-21 DIAGNOSIS — M170 Bilateral primary osteoarthritis of knee: Secondary | ICD-10-CM | POA: Diagnosis not present

## 2017-10-21 DIAGNOSIS — J449 Chronic obstructive pulmonary disease, unspecified: Secondary | ICD-10-CM | POA: Diagnosis not present

## 2017-10-27 DIAGNOSIS — F3175 Bipolar disorder, in partial remission, most recent episode depressed: Secondary | ICD-10-CM | POA: Diagnosis not present

## 2017-10-27 DIAGNOSIS — G20 Parkinson's disease: Secondary | ICD-10-CM | POA: Diagnosis not present

## 2017-10-27 DIAGNOSIS — F329 Major depressive disorder, single episode, unspecified: Secondary | ICD-10-CM | POA: Diagnosis not present

## 2017-10-27 DIAGNOSIS — G629 Polyneuropathy, unspecified: Secondary | ICD-10-CM | POA: Diagnosis not present

## 2017-10-27 DIAGNOSIS — G250 Essential tremor: Secondary | ICD-10-CM | POA: Diagnosis not present

## 2017-11-02 DIAGNOSIS — F064 Anxiety disorder due to known physiological condition: Secondary | ICD-10-CM | POA: Diagnosis not present

## 2017-11-02 DIAGNOSIS — F5105 Insomnia due to other mental disorder: Secondary | ICD-10-CM | POA: Diagnosis not present

## 2017-11-02 DIAGNOSIS — F3175 Bipolar disorder, in partial remission, most recent episode depressed: Secondary | ICD-10-CM | POA: Diagnosis not present

## 2017-11-03 DIAGNOSIS — R569 Unspecified convulsions: Secondary | ICD-10-CM | POA: Diagnosis not present

## 2017-11-03 DIAGNOSIS — Z79899 Other long term (current) drug therapy: Secondary | ICD-10-CM | POA: Diagnosis not present

## 2017-11-03 DIAGNOSIS — F445 Conversion disorder with seizures or convulsions: Secondary | ICD-10-CM | POA: Diagnosis not present

## 2017-11-13 DIAGNOSIS — F5105 Insomnia due to other mental disorder: Secondary | ICD-10-CM | POA: Diagnosis not present

## 2017-11-13 DIAGNOSIS — F3175 Bipolar disorder, in partial remission, most recent episode depressed: Secondary | ICD-10-CM | POA: Diagnosis not present

## 2017-11-17 DIAGNOSIS — F3175 Bipolar disorder, in partial remission, most recent episode depressed: Secondary | ICD-10-CM | POA: Diagnosis not present

## 2017-11-18 DIAGNOSIS — J449 Chronic obstructive pulmonary disease, unspecified: Secondary | ICD-10-CM | POA: Diagnosis not present

## 2017-11-18 DIAGNOSIS — G20 Parkinson's disease: Secondary | ICD-10-CM | POA: Diagnosis not present

## 2017-11-18 DIAGNOSIS — E038 Other specified hypothyroidism: Secondary | ICD-10-CM | POA: Diagnosis not present

## 2017-11-18 DIAGNOSIS — M170 Bilateral primary osteoarthritis of knee: Secondary | ICD-10-CM | POA: Diagnosis not present

## 2017-11-24 DIAGNOSIS — F3175 Bipolar disorder, in partial remission, most recent episode depressed: Secondary | ICD-10-CM | POA: Diagnosis not present

## 2017-12-01 DIAGNOSIS — F3175 Bipolar disorder, in partial remission, most recent episode depressed: Secondary | ICD-10-CM | POA: Diagnosis not present

## 2017-12-14 DIAGNOSIS — F5105 Insomnia due to other mental disorder: Secondary | ICD-10-CM | POA: Diagnosis not present

## 2017-12-14 DIAGNOSIS — F3175 Bipolar disorder, in partial remission, most recent episode depressed: Secondary | ICD-10-CM | POA: Diagnosis not present

## 2017-12-14 DIAGNOSIS — F064 Anxiety disorder due to known physiological condition: Secondary | ICD-10-CM | POA: Diagnosis not present

## 2017-12-15 DIAGNOSIS — H524 Presbyopia: Secondary | ICD-10-CM | POA: Diagnosis not present

## 2017-12-15 DIAGNOSIS — Z961 Presence of intraocular lens: Secondary | ICD-10-CM | POA: Diagnosis not present

## 2017-12-15 DIAGNOSIS — H04123 Dry eye syndrome of bilateral lacrimal glands: Secondary | ICD-10-CM | POA: Diagnosis not present

## 2017-12-16 DIAGNOSIS — E038 Other specified hypothyroidism: Secondary | ICD-10-CM | POA: Diagnosis not present

## 2017-12-16 DIAGNOSIS — G20 Parkinson's disease: Secondary | ICD-10-CM | POA: Diagnosis not present

## 2017-12-16 DIAGNOSIS — J449 Chronic obstructive pulmonary disease, unspecified: Secondary | ICD-10-CM | POA: Diagnosis not present

## 2017-12-16 DIAGNOSIS — M170 Bilateral primary osteoarthritis of knee: Secondary | ICD-10-CM | POA: Diagnosis not present

## 2017-12-19 DIAGNOSIS — F3175 Bipolar disorder, in partial remission, most recent episode depressed: Secondary | ICD-10-CM | POA: Diagnosis not present

## 2017-12-19 DIAGNOSIS — F5105 Insomnia due to other mental disorder: Secondary | ICD-10-CM | POA: Diagnosis not present

## 2017-12-19 DIAGNOSIS — F064 Anxiety disorder due to known physiological condition: Secondary | ICD-10-CM | POA: Diagnosis not present

## 2017-12-22 DIAGNOSIS — F3175 Bipolar disorder, in partial remission, most recent episode depressed: Secondary | ICD-10-CM | POA: Diagnosis not present

## 2017-12-29 DIAGNOSIS — F3175 Bipolar disorder, in partial remission, most recent episode depressed: Secondary | ICD-10-CM | POA: Diagnosis not present

## 2017-12-30 DIAGNOSIS — Z5181 Encounter for therapeutic drug level monitoring: Secondary | ICD-10-CM | POA: Diagnosis not present

## 2017-12-30 DIAGNOSIS — E039 Hypothyroidism, unspecified: Secondary | ICD-10-CM | POA: Diagnosis not present

## 2017-12-30 DIAGNOSIS — R569 Unspecified convulsions: Secondary | ICD-10-CM | POA: Diagnosis not present

## 2018-01-06 DIAGNOSIS — J449 Chronic obstructive pulmonary disease, unspecified: Secondary | ICD-10-CM | POA: Diagnosis not present

## 2018-01-06 DIAGNOSIS — G20 Parkinson's disease: Secondary | ICD-10-CM | POA: Diagnosis not present

## 2018-01-06 DIAGNOSIS — M170 Bilateral primary osteoarthritis of knee: Secondary | ICD-10-CM | POA: Diagnosis not present

## 2018-01-06 DIAGNOSIS — E038 Other specified hypothyroidism: Secondary | ICD-10-CM | POA: Diagnosis not present

## 2018-01-11 DIAGNOSIS — J069 Acute upper respiratory infection, unspecified: Secondary | ICD-10-CM | POA: Diagnosis not present

## 2018-01-11 DIAGNOSIS — M719 Bursopathy, unspecified: Secondary | ICD-10-CM | POA: Diagnosis not present

## 2018-01-14 DIAGNOSIS — J0101 Acute recurrent maxillary sinusitis: Secondary | ICD-10-CM | POA: Diagnosis not present

## 2018-01-19 DIAGNOSIS — F3175 Bipolar disorder, in partial remission, most recent episode depressed: Secondary | ICD-10-CM | POA: Diagnosis not present

## 2018-01-20 DIAGNOSIS — M79675 Pain in left toe(s): Secondary | ICD-10-CM | POA: Diagnosis not present

## 2018-01-20 DIAGNOSIS — B351 Tinea unguium: Secondary | ICD-10-CM | POA: Diagnosis not present

## 2018-01-20 DIAGNOSIS — I739 Peripheral vascular disease, unspecified: Secondary | ICD-10-CM | POA: Diagnosis not present

## 2018-01-20 DIAGNOSIS — L84 Corns and callosities: Secondary | ICD-10-CM | POA: Diagnosis not present

## 2018-01-27 DIAGNOSIS — Z23 Encounter for immunization: Secondary | ICD-10-CM | POA: Diagnosis not present

## 2018-02-01 DIAGNOSIS — J0101 Acute recurrent maxillary sinusitis: Secondary | ICD-10-CM | POA: Diagnosis not present

## 2018-02-02 DIAGNOSIS — F3175 Bipolar disorder, in partial remission, most recent episode depressed: Secondary | ICD-10-CM | POA: Diagnosis not present

## 2018-02-06 DIAGNOSIS — D649 Anemia, unspecified: Secondary | ICD-10-CM | POA: Diagnosis not present

## 2018-02-06 DIAGNOSIS — R05 Cough: Secondary | ICD-10-CM | POA: Diagnosis not present

## 2018-02-06 DIAGNOSIS — J449 Chronic obstructive pulmonary disease, unspecified: Secondary | ICD-10-CM | POA: Diagnosis not present

## 2018-02-16 DIAGNOSIS — F3175 Bipolar disorder, in partial remission, most recent episode depressed: Secondary | ICD-10-CM | POA: Diagnosis not present

## 2018-02-17 DIAGNOSIS — R110 Nausea: Secondary | ICD-10-CM | POA: Diagnosis not present

## 2018-02-17 DIAGNOSIS — R1010 Upper abdominal pain, unspecified: Secondary | ICD-10-CM | POA: Diagnosis not present

## 2018-02-19 DIAGNOSIS — K8020 Calculus of gallbladder without cholecystitis without obstruction: Secondary | ICD-10-CM | POA: Diagnosis not present

## 2018-02-21 DIAGNOSIS — J449 Chronic obstructive pulmonary disease, unspecified: Secondary | ICD-10-CM | POA: Diagnosis not present

## 2018-02-21 DIAGNOSIS — Z7951 Long term (current) use of inhaled steroids: Secondary | ICD-10-CM | POA: Diagnosis not present

## 2018-02-21 DIAGNOSIS — E039 Hypothyroidism, unspecified: Secondary | ICD-10-CM | POA: Diagnosis not present

## 2018-02-21 DIAGNOSIS — R1013 Epigastric pain: Secondary | ICD-10-CM | POA: Diagnosis not present

## 2018-02-21 DIAGNOSIS — R5381 Other malaise: Secondary | ICD-10-CM | POA: Diagnosis not present

## 2018-02-21 DIAGNOSIS — Z79899 Other long term (current) drug therapy: Secondary | ICD-10-CM | POA: Diagnosis not present

## 2018-02-21 DIAGNOSIS — F419 Anxiety disorder, unspecified: Secondary | ICD-10-CM | POA: Diagnosis not present

## 2018-02-21 DIAGNOSIS — K5730 Diverticulosis of large intestine without perforation or abscess without bleeding: Secondary | ICD-10-CM | POA: Diagnosis not present

## 2018-02-21 DIAGNOSIS — Z87891 Personal history of nicotine dependence: Secondary | ICD-10-CM | POA: Diagnosis not present

## 2018-02-21 DIAGNOSIS — K8020 Calculus of gallbladder without cholecystitis without obstruction: Secondary | ICD-10-CM | POA: Diagnosis not present

## 2018-02-21 DIAGNOSIS — F319 Bipolar disorder, unspecified: Secondary | ICD-10-CM | POA: Diagnosis not present

## 2018-02-21 DIAGNOSIS — R1011 Right upper quadrant pain: Secondary | ICD-10-CM | POA: Diagnosis not present

## 2018-02-21 DIAGNOSIS — E7800 Pure hypercholesterolemia, unspecified: Secondary | ICD-10-CM | POA: Diagnosis not present

## 2018-02-21 DIAGNOSIS — Z7401 Bed confinement status: Secondary | ICD-10-CM | POA: Diagnosis not present

## 2018-02-21 DIAGNOSIS — N858 Other specified noninflammatory disorders of uterus: Secondary | ICD-10-CM | POA: Diagnosis not present

## 2018-02-21 DIAGNOSIS — K8080 Other cholelithiasis without obstruction: Secondary | ICD-10-CM | POA: Diagnosis not present

## 2018-02-21 DIAGNOSIS — N8500 Endometrial hyperplasia, unspecified: Secondary | ICD-10-CM | POA: Diagnosis not present

## 2018-02-22 DIAGNOSIS — F3175 Bipolar disorder, in partial remission, most recent episode depressed: Secondary | ICD-10-CM | POA: Diagnosis not present

## 2018-02-22 DIAGNOSIS — F064 Anxiety disorder due to known physiological condition: Secondary | ICD-10-CM | POA: Diagnosis not present

## 2018-02-22 DIAGNOSIS — F5105 Insomnia due to other mental disorder: Secondary | ICD-10-CM | POA: Diagnosis not present

## 2018-02-24 DIAGNOSIS — Z9842 Cataract extraction status, left eye: Secondary | ICD-10-CM | POA: Diagnosis not present

## 2018-02-24 DIAGNOSIS — H524 Presbyopia: Secondary | ICD-10-CM | POA: Diagnosis not present

## 2018-02-24 DIAGNOSIS — Z87891 Personal history of nicotine dependence: Secondary | ICD-10-CM | POA: Diagnosis not present

## 2018-02-24 DIAGNOSIS — M6281 Muscle weakness (generalized): Secondary | ICD-10-CM | POA: Diagnosis not present

## 2018-02-24 DIAGNOSIS — E039 Hypothyroidism, unspecified: Secondary | ICD-10-CM | POA: Diagnosis not present

## 2018-02-24 DIAGNOSIS — Z79899 Other long term (current) drug therapy: Secondary | ICD-10-CM | POA: Diagnosis not present

## 2018-02-24 DIAGNOSIS — H04123 Dry eye syndrome of bilateral lacrimal glands: Secondary | ICD-10-CM | POA: Diagnosis not present

## 2018-02-24 DIAGNOSIS — M170 Bilateral primary osteoarthritis of knee: Secondary | ICD-10-CM | POA: Diagnosis not present

## 2018-02-24 DIAGNOSIS — K130 Diseases of lips: Secondary | ICD-10-CM | POA: Diagnosis not present

## 2018-02-24 DIAGNOSIS — J9611 Chronic respiratory failure with hypoxia: Secondary | ICD-10-CM | POA: Diagnosis not present

## 2018-02-24 DIAGNOSIS — Z743 Need for continuous supervision: Secondary | ICD-10-CM | POA: Diagnosis not present

## 2018-02-24 DIAGNOSIS — Z4889 Encounter for other specified surgical aftercare: Secondary | ICD-10-CM | POA: Diagnosis not present

## 2018-02-24 DIAGNOSIS — K8020 Calculus of gallbladder without cholecystitis without obstruction: Secondary | ICD-10-CM | POA: Diagnosis not present

## 2018-02-24 DIAGNOSIS — E785 Hyperlipidemia, unspecified: Secondary | ICD-10-CM | POA: Diagnosis not present

## 2018-02-24 DIAGNOSIS — R609 Edema, unspecified: Secondary | ICD-10-CM | POA: Diagnosis not present

## 2018-02-24 DIAGNOSIS — K915 Postcholecystectomy syndrome: Secondary | ICD-10-CM | POA: Diagnosis not present

## 2018-02-24 DIAGNOSIS — F411 Generalized anxiety disorder: Secondary | ICD-10-CM | POA: Diagnosis not present

## 2018-02-24 DIAGNOSIS — Z6830 Body mass index (BMI) 30.0-30.9, adult: Secondary | ICD-10-CM | POA: Diagnosis not present

## 2018-02-24 DIAGNOSIS — K5900 Constipation, unspecified: Secondary | ICD-10-CM | POA: Diagnosis not present

## 2018-02-24 DIAGNOSIS — Z9841 Cataract extraction status, right eye: Secondary | ICD-10-CM | POA: Diagnosis not present

## 2018-02-24 DIAGNOSIS — Z7401 Bed confinement status: Secondary | ICD-10-CM | POA: Diagnosis not present

## 2018-02-24 DIAGNOSIS — I10 Essential (primary) hypertension: Secondary | ICD-10-CM | POA: Diagnosis not present

## 2018-02-24 DIAGNOSIS — E669 Obesity, unspecified: Secondary | ICD-10-CM | POA: Diagnosis present

## 2018-02-24 DIAGNOSIS — K8050 Calculus of bile duct without cholangitis or cholecystitis without obstruction: Secondary | ICD-10-CM | POA: Diagnosis not present

## 2018-02-24 DIAGNOSIS — M436 Torticollis: Secondary | ICD-10-CM | POA: Diagnosis not present

## 2018-02-24 DIAGNOSIS — K8080 Other cholelithiasis without obstruction: Secondary | ICD-10-CM | POA: Diagnosis not present

## 2018-02-24 DIAGNOSIS — J9612 Chronic respiratory failure with hypercapnia: Secondary | ICD-10-CM | POA: Diagnosis not present

## 2018-02-24 DIAGNOSIS — K8010 Calculus of gallbladder with chronic cholecystitis without obstruction: Secondary | ICD-10-CM | POA: Diagnosis not present

## 2018-02-24 DIAGNOSIS — G250 Essential tremor: Secondary | ICD-10-CM | POA: Diagnosis not present

## 2018-02-24 DIAGNOSIS — E038 Other specified hypothyroidism: Secondary | ICD-10-CM | POA: Diagnosis not present

## 2018-02-24 DIAGNOSIS — R5381 Other malaise: Secondary | ICD-10-CM | POA: Diagnosis not present

## 2018-02-24 DIAGNOSIS — F319 Bipolar disorder, unspecified: Secondary | ICD-10-CM | POA: Diagnosis not present

## 2018-02-24 DIAGNOSIS — J449 Chronic obstructive pulmonary disease, unspecified: Secondary | ICD-10-CM | POA: Diagnosis not present

## 2018-02-24 DIAGNOSIS — F5105 Insomnia due to other mental disorder: Secondary | ICD-10-CM | POA: Diagnosis not present

## 2018-02-24 DIAGNOSIS — N3281 Overactive bladder: Secondary | ICD-10-CM | POA: Diagnosis not present

## 2018-02-24 DIAGNOSIS — G20 Parkinson's disease: Secondary | ICD-10-CM | POA: Diagnosis not present

## 2018-02-24 DIAGNOSIS — I739 Peripheral vascular disease, unspecified: Secondary | ICD-10-CM | POA: Diagnosis not present

## 2018-02-27 DIAGNOSIS — K5900 Constipation, unspecified: Secondary | ICD-10-CM | POA: Diagnosis not present

## 2018-02-27 DIAGNOSIS — E038 Other specified hypothyroidism: Secondary | ICD-10-CM | POA: Diagnosis not present

## 2018-02-27 DIAGNOSIS — F3175 Bipolar disorder, in partial remission, most recent episode depressed: Secondary | ICD-10-CM | POA: Diagnosis not present

## 2018-02-27 DIAGNOSIS — H524 Presbyopia: Secondary | ICD-10-CM | POA: Diagnosis not present

## 2018-02-27 DIAGNOSIS — E669 Obesity, unspecified: Secondary | ICD-10-CM | POA: Diagnosis not present

## 2018-02-27 DIAGNOSIS — F319 Bipolar disorder, unspecified: Secondary | ICD-10-CM | POA: Diagnosis not present

## 2018-02-27 DIAGNOSIS — E039 Hypothyroidism, unspecified: Secondary | ICD-10-CM | POA: Diagnosis not present

## 2018-02-27 DIAGNOSIS — E785 Hyperlipidemia, unspecified: Secondary | ICD-10-CM | POA: Diagnosis not present

## 2018-02-27 DIAGNOSIS — R5381 Other malaise: Secondary | ICD-10-CM | POA: Diagnosis not present

## 2018-02-27 DIAGNOSIS — K8080 Other cholelithiasis without obstruction: Secondary | ICD-10-CM | POA: Diagnosis not present

## 2018-02-27 DIAGNOSIS — M436 Torticollis: Secondary | ICD-10-CM | POA: Diagnosis not present

## 2018-02-27 DIAGNOSIS — F411 Generalized anxiety disorder: Secondary | ICD-10-CM | POA: Diagnosis not present

## 2018-02-27 DIAGNOSIS — N3281 Overactive bladder: Secondary | ICD-10-CM | POA: Diagnosis not present

## 2018-02-27 DIAGNOSIS — F5105 Insomnia due to other mental disorder: Secondary | ICD-10-CM | POA: Diagnosis not present

## 2018-02-27 DIAGNOSIS — K130 Diseases of lips: Secondary | ICD-10-CM | POA: Diagnosis not present

## 2018-02-27 DIAGNOSIS — J9612 Chronic respiratory failure with hypercapnia: Secondary | ICD-10-CM | POA: Diagnosis not present

## 2018-02-27 DIAGNOSIS — K819 Cholecystitis, unspecified: Secondary | ICD-10-CM | POA: Diagnosis not present

## 2018-02-27 DIAGNOSIS — G20 Parkinson's disease: Secondary | ICD-10-CM | POA: Diagnosis not present

## 2018-02-27 DIAGNOSIS — Z7401 Bed confinement status: Secondary | ICD-10-CM | POA: Diagnosis not present

## 2018-02-27 DIAGNOSIS — I739 Peripheral vascular disease, unspecified: Secondary | ICD-10-CM | POA: Diagnosis not present

## 2018-02-27 DIAGNOSIS — M6281 Muscle weakness (generalized): Secondary | ICD-10-CM | POA: Diagnosis not present

## 2018-02-27 DIAGNOSIS — M170 Bilateral primary osteoarthritis of knee: Secondary | ICD-10-CM | POA: Diagnosis not present

## 2018-02-27 DIAGNOSIS — J9611 Chronic respiratory failure with hypoxia: Secondary | ICD-10-CM | POA: Diagnosis not present

## 2018-02-27 DIAGNOSIS — R609 Edema, unspecified: Secondary | ICD-10-CM | POA: Diagnosis not present

## 2018-02-27 DIAGNOSIS — H04123 Dry eye syndrome of bilateral lacrimal glands: Secondary | ICD-10-CM | POA: Diagnosis not present

## 2018-02-27 DIAGNOSIS — J449 Chronic obstructive pulmonary disease, unspecified: Secondary | ICD-10-CM | POA: Diagnosis not present

## 2018-02-27 DIAGNOSIS — K915 Postcholecystectomy syndrome: Secondary | ICD-10-CM | POA: Diagnosis not present

## 2018-03-01 DIAGNOSIS — G20 Parkinson's disease: Secondary | ICD-10-CM | POA: Diagnosis not present

## 2018-03-01 DIAGNOSIS — K819 Cholecystitis, unspecified: Secondary | ICD-10-CM | POA: Diagnosis not present

## 2018-03-01 DIAGNOSIS — J449 Chronic obstructive pulmonary disease, unspecified: Secondary | ICD-10-CM | POA: Diagnosis not present

## 2018-03-03 DIAGNOSIS — G20 Parkinson's disease: Secondary | ICD-10-CM | POA: Diagnosis not present

## 2018-03-03 DIAGNOSIS — M170 Bilateral primary osteoarthritis of knee: Secondary | ICD-10-CM | POA: Diagnosis not present

## 2018-03-03 DIAGNOSIS — J449 Chronic obstructive pulmonary disease, unspecified: Secondary | ICD-10-CM | POA: Diagnosis not present

## 2018-03-03 DIAGNOSIS — E038 Other specified hypothyroidism: Secondary | ICD-10-CM | POA: Diagnosis not present

## 2018-03-16 DIAGNOSIS — F3175 Bipolar disorder, in partial remission, most recent episode depressed: Secondary | ICD-10-CM | POA: Diagnosis not present

## 2018-03-20 DIAGNOSIS — F064 Anxiety disorder due to known physiological condition: Secondary | ICD-10-CM | POA: Diagnosis not present

## 2018-03-20 DIAGNOSIS — F3175 Bipolar disorder, in partial remission, most recent episode depressed: Secondary | ICD-10-CM | POA: Diagnosis not present

## 2018-03-24 DIAGNOSIS — D649 Anemia, unspecified: Secondary | ICD-10-CM | POA: Diagnosis not present

## 2018-03-24 DIAGNOSIS — Z5181 Encounter for therapeutic drug level monitoring: Secondary | ICD-10-CM | POA: Diagnosis not present

## 2018-03-24 DIAGNOSIS — R569 Unspecified convulsions: Secondary | ICD-10-CM | POA: Diagnosis not present

## 2018-03-24 DIAGNOSIS — E039 Hypothyroidism, unspecified: Secondary | ICD-10-CM | POA: Diagnosis not present

## 2018-03-24 DIAGNOSIS — I10 Essential (primary) hypertension: Secondary | ICD-10-CM | POA: Diagnosis not present

## 2018-03-24 DIAGNOSIS — E785 Hyperlipidemia, unspecified: Secondary | ICD-10-CM | POA: Diagnosis not present

## 2018-03-29 DIAGNOSIS — I739 Peripheral vascular disease, unspecified: Secondary | ICD-10-CM | POA: Diagnosis not present

## 2018-03-29 DIAGNOSIS — J449 Chronic obstructive pulmonary disease, unspecified: Secondary | ICD-10-CM | POA: Diagnosis not present

## 2018-03-29 DIAGNOSIS — E038 Other specified hypothyroidism: Secondary | ICD-10-CM | POA: Diagnosis not present

## 2018-03-29 DIAGNOSIS — G20 Parkinson's disease: Secondary | ICD-10-CM | POA: Diagnosis not present

## 2018-03-30 DIAGNOSIS — F3175 Bipolar disorder, in partial remission, most recent episode depressed: Secondary | ICD-10-CM | POA: Diagnosis not present

## 2018-03-31 DIAGNOSIS — F3175 Bipolar disorder, in partial remission, most recent episode depressed: Secondary | ICD-10-CM | POA: Diagnosis not present

## 2018-03-31 DIAGNOSIS — F5105 Insomnia due to other mental disorder: Secondary | ICD-10-CM | POA: Diagnosis not present

## 2018-03-31 DIAGNOSIS — F064 Anxiety disorder due to known physiological condition: Secondary | ICD-10-CM | POA: Diagnosis not present

## 2018-04-13 DIAGNOSIS — F3175 Bipolar disorder, in partial remission, most recent episode depressed: Secondary | ICD-10-CM | POA: Diagnosis not present

## 2018-04-22 DIAGNOSIS — E039 Hypothyroidism, unspecified: Secondary | ICD-10-CM | POA: Diagnosis not present

## 2018-04-26 DIAGNOSIS — M170 Bilateral primary osteoarthritis of knee: Secondary | ICD-10-CM | POA: Diagnosis not present

## 2018-04-26 DIAGNOSIS — I739 Peripheral vascular disease, unspecified: Secondary | ICD-10-CM | POA: Diagnosis not present

## 2018-04-26 DIAGNOSIS — J449 Chronic obstructive pulmonary disease, unspecified: Secondary | ICD-10-CM | POA: Diagnosis not present

## 2018-04-26 DIAGNOSIS — G20 Parkinson's disease: Secondary | ICD-10-CM | POA: Diagnosis not present

## 2018-04-27 DIAGNOSIS — F3175 Bipolar disorder, in partial remission, most recent episode depressed: Secondary | ICD-10-CM | POA: Diagnosis not present

## 2018-05-11 DIAGNOSIS — F3175 Bipolar disorder, in partial remission, most recent episode depressed: Secondary | ICD-10-CM | POA: Diagnosis not present

## 2018-05-12 DIAGNOSIS — F3175 Bipolar disorder, in partial remission, most recent episode depressed: Secondary | ICD-10-CM | POA: Diagnosis not present

## 2018-05-12 DIAGNOSIS — F5105 Insomnia due to other mental disorder: Secondary | ICD-10-CM | POA: Diagnosis not present

## 2018-05-12 DIAGNOSIS — F064 Anxiety disorder due to known physiological condition: Secondary | ICD-10-CM | POA: Diagnosis not present

## 2018-05-17 DIAGNOSIS — R319 Hematuria, unspecified: Secondary | ICD-10-CM | POA: Diagnosis not present

## 2018-05-17 DIAGNOSIS — N390 Urinary tract infection, site not specified: Secondary | ICD-10-CM | POA: Diagnosis not present

## 2018-05-19 DIAGNOSIS — M79675 Pain in left toe(s): Secondary | ICD-10-CM | POA: Diagnosis not present

## 2018-05-19 DIAGNOSIS — B351 Tinea unguium: Secondary | ICD-10-CM | POA: Diagnosis not present

## 2018-05-19 DIAGNOSIS — I739 Peripheral vascular disease, unspecified: Secondary | ICD-10-CM | POA: Diagnosis not present

## 2018-05-21 DIAGNOSIS — F3175 Bipolar disorder, in partial remission, most recent episode depressed: Secondary | ICD-10-CM | POA: Diagnosis not present

## 2018-05-21 DIAGNOSIS — F064 Anxiety disorder due to known physiological condition: Secondary | ICD-10-CM | POA: Diagnosis not present

## 2018-05-25 DIAGNOSIS — F3175 Bipolar disorder, in partial remission, most recent episode depressed: Secondary | ICD-10-CM | POA: Diagnosis not present

## 2018-05-31 DIAGNOSIS — R531 Weakness: Secondary | ICD-10-CM | POA: Diagnosis not present

## 2018-05-31 DIAGNOSIS — G629 Polyneuropathy, unspecified: Secondary | ICD-10-CM | POA: Diagnosis not present

## 2018-05-31 DIAGNOSIS — G250 Essential tremor: Secondary | ICD-10-CM | POA: Diagnosis not present

## 2018-05-31 DIAGNOSIS — G20 Parkinson's disease: Secondary | ICD-10-CM | POA: Diagnosis not present

## 2018-06-02 DIAGNOSIS — J449 Chronic obstructive pulmonary disease, unspecified: Secondary | ICD-10-CM | POA: Diagnosis not present

## 2018-06-02 DIAGNOSIS — M170 Bilateral primary osteoarthritis of knee: Secondary | ICD-10-CM | POA: Diagnosis not present

## 2018-06-02 DIAGNOSIS — I739 Peripheral vascular disease, unspecified: Secondary | ICD-10-CM | POA: Diagnosis not present

## 2018-06-02 DIAGNOSIS — G20 Parkinson's disease: Secondary | ICD-10-CM | POA: Diagnosis not present

## 2018-06-08 DIAGNOSIS — F3175 Bipolar disorder, in partial remission, most recent episode depressed: Secondary | ICD-10-CM | POA: Diagnosis not present

## 2018-06-09 DIAGNOSIS — F3175 Bipolar disorder, in partial remission, most recent episode depressed: Secondary | ICD-10-CM | POA: Diagnosis not present

## 2018-06-09 DIAGNOSIS — F5105 Insomnia due to other mental disorder: Secondary | ICD-10-CM | POA: Diagnosis not present

## 2018-06-09 DIAGNOSIS — F064 Anxiety disorder due to known physiological condition: Secondary | ICD-10-CM | POA: Diagnosis not present

## 2018-06-19 DIAGNOSIS — G300 Alzheimer's disease with early onset: Secondary | ICD-10-CM | POA: Diagnosis not present

## 2018-06-19 DIAGNOSIS — F3175 Bipolar disorder, in partial remission, most recent episode depressed: Secondary | ICD-10-CM | POA: Diagnosis not present

## 2018-06-22 DIAGNOSIS — F3175 Bipolar disorder, in partial remission, most recent episode depressed: Secondary | ICD-10-CM | POA: Diagnosis not present

## 2018-06-23 DIAGNOSIS — Z5181 Encounter for therapeutic drug level monitoring: Secondary | ICD-10-CM | POA: Diagnosis not present

## 2018-06-23 DIAGNOSIS — L700 Acne vulgaris: Secondary | ICD-10-CM | POA: Diagnosis not present

## 2018-06-23 DIAGNOSIS — E039 Hypothyroidism, unspecified: Secondary | ICD-10-CM | POA: Diagnosis not present

## 2018-06-23 DIAGNOSIS — G4700 Insomnia, unspecified: Secondary | ICD-10-CM | POA: Diagnosis not present

## 2018-06-23 DIAGNOSIS — J449 Chronic obstructive pulmonary disease, unspecified: Secondary | ICD-10-CM | POA: Diagnosis not present

## 2018-06-23 DIAGNOSIS — R569 Unspecified convulsions: Secondary | ICD-10-CM | POA: Diagnosis not present

## 2018-06-23 DIAGNOSIS — J00 Acute nasopharyngitis [common cold]: Secondary | ICD-10-CM | POA: Diagnosis not present

## 2018-07-05 DIAGNOSIS — F5105 Insomnia due to other mental disorder: Secondary | ICD-10-CM | POA: Diagnosis not present

## 2018-07-05 DIAGNOSIS — F064 Anxiety disorder due to known physiological condition: Secondary | ICD-10-CM | POA: Diagnosis not present

## 2018-07-05 DIAGNOSIS — F3175 Bipolar disorder, in partial remission, most recent episode depressed: Secondary | ICD-10-CM | POA: Diagnosis not present

## 2018-07-06 DIAGNOSIS — F3175 Bipolar disorder, in partial remission, most recent episode depressed: Secondary | ICD-10-CM | POA: Diagnosis not present

## 2018-07-14 DIAGNOSIS — I10 Essential (primary) hypertension: Secondary | ICD-10-CM | POA: Diagnosis not present

## 2018-07-14 DIAGNOSIS — E039 Hypothyroidism, unspecified: Secondary | ICD-10-CM | POA: Diagnosis not present

## 2018-07-14 DIAGNOSIS — Z79899 Other long term (current) drug therapy: Secondary | ICD-10-CM | POA: Diagnosis not present

## 2018-07-14 DIAGNOSIS — R569 Unspecified convulsions: Secondary | ICD-10-CM | POA: Diagnosis not present

## 2018-07-15 DIAGNOSIS — E785 Hyperlipidemia, unspecified: Secondary | ICD-10-CM | POA: Diagnosis not present

## 2018-07-20 DIAGNOSIS — F3175 Bipolar disorder, in partial remission, most recent episode depressed: Secondary | ICD-10-CM | POA: Diagnosis not present

## 2018-07-20 DIAGNOSIS — F3162 Bipolar disorder, current episode mixed, moderate: Secondary | ICD-10-CM | POA: Diagnosis not present

## 2018-07-20 DIAGNOSIS — I252 Old myocardial infarction: Secondary | ICD-10-CM | POA: Diagnosis not present

## 2018-07-21 DIAGNOSIS — G20 Parkinson's disease: Secondary | ICD-10-CM | POA: Diagnosis not present

## 2018-07-21 DIAGNOSIS — J449 Chronic obstructive pulmonary disease, unspecified: Secondary | ICD-10-CM | POA: Diagnosis not present

## 2018-07-21 DIAGNOSIS — M170 Bilateral primary osteoarthritis of knee: Secondary | ICD-10-CM | POA: Diagnosis not present

## 2018-07-21 DIAGNOSIS — G4700 Insomnia, unspecified: Secondary | ICD-10-CM | POA: Diagnosis not present

## 2018-07-27 DIAGNOSIS — F445 Conversion disorder with seizures or convulsions: Secondary | ICD-10-CM | POA: Diagnosis not present

## 2018-07-27 DIAGNOSIS — R569 Unspecified convulsions: Secondary | ICD-10-CM | POA: Diagnosis not present

## 2018-08-02 DIAGNOSIS — M170 Bilateral primary osteoarthritis of knee: Secondary | ICD-10-CM | POA: Diagnosis not present

## 2018-08-02 DIAGNOSIS — F064 Anxiety disorder due to known physiological condition: Secondary | ICD-10-CM | POA: Diagnosis not present

## 2018-08-02 DIAGNOSIS — G20 Parkinson's disease: Secondary | ICD-10-CM | POA: Diagnosis not present

## 2018-08-02 DIAGNOSIS — J449 Chronic obstructive pulmonary disease, unspecified: Secondary | ICD-10-CM | POA: Diagnosis not present

## 2018-08-02 DIAGNOSIS — F3175 Bipolar disorder, in partial remission, most recent episode depressed: Secondary | ICD-10-CM | POA: Diagnosis not present

## 2018-08-02 DIAGNOSIS — F5105 Insomnia due to other mental disorder: Secondary | ICD-10-CM | POA: Diagnosis not present

## 2018-08-07 DIAGNOSIS — Z5181 Encounter for therapeutic drug level monitoring: Secondary | ICD-10-CM | POA: Diagnosis not present

## 2018-08-09 DIAGNOSIS — J441 Chronic obstructive pulmonary disease with (acute) exacerbation: Secondary | ICD-10-CM | POA: Diagnosis not present

## 2018-08-09 DIAGNOSIS — M170 Bilateral primary osteoarthritis of knee: Secondary | ICD-10-CM | POA: Diagnosis not present

## 2018-08-09 DIAGNOSIS — R062 Wheezing: Secondary | ICD-10-CM | POA: Diagnosis not present

## 2018-08-09 DIAGNOSIS — J449 Chronic obstructive pulmonary disease, unspecified: Secondary | ICD-10-CM | POA: Diagnosis not present

## 2018-08-09 DIAGNOSIS — G20 Parkinson's disease: Secondary | ICD-10-CM | POA: Diagnosis not present

## 2018-08-10 DIAGNOSIS — D649 Anemia, unspecified: Secondary | ICD-10-CM | POA: Diagnosis not present

## 2018-08-10 DIAGNOSIS — I10 Essential (primary) hypertension: Secondary | ICD-10-CM | POA: Diagnosis not present

## 2018-08-11 DIAGNOSIS — G20 Parkinson's disease: Secondary | ICD-10-CM | POA: Diagnosis not present

## 2018-08-11 DIAGNOSIS — M170 Bilateral primary osteoarthritis of knee: Secondary | ICD-10-CM | POA: Diagnosis not present

## 2018-08-11 DIAGNOSIS — E039 Hypothyroidism, unspecified: Secondary | ICD-10-CM | POA: Diagnosis not present

## 2018-08-11 DIAGNOSIS — G250 Essential tremor: Secondary | ICD-10-CM | POA: Diagnosis not present

## 2018-08-11 DIAGNOSIS — J441 Chronic obstructive pulmonary disease with (acute) exacerbation: Secondary | ICD-10-CM | POA: Diagnosis not present

## 2018-08-16 DIAGNOSIS — J449 Chronic obstructive pulmonary disease, unspecified: Secondary | ICD-10-CM | POA: Diagnosis not present

## 2018-08-16 DIAGNOSIS — G20 Parkinson's disease: Secondary | ICD-10-CM | POA: Diagnosis not present

## 2018-08-16 DIAGNOSIS — I10 Essential (primary) hypertension: Secondary | ICD-10-CM | POA: Diagnosis not present

## 2018-08-16 DIAGNOSIS — R682 Dry mouth, unspecified: Secondary | ICD-10-CM | POA: Diagnosis not present

## 2018-08-18 DIAGNOSIS — Z5181 Encounter for therapeutic drug level monitoring: Secondary | ICD-10-CM | POA: Diagnosis not present

## 2018-08-24 DIAGNOSIS — F3175 Bipolar disorder, in partial remission, most recent episode depressed: Secondary | ICD-10-CM | POA: Diagnosis not present

## 2018-08-24 DIAGNOSIS — F5105 Insomnia due to other mental disorder: Secondary | ICD-10-CM | POA: Diagnosis not present

## 2018-08-24 DIAGNOSIS — F064 Anxiety disorder due to known physiological condition: Secondary | ICD-10-CM | POA: Diagnosis not present

## 2018-08-30 DIAGNOSIS — F064 Anxiety disorder due to known physiological condition: Secondary | ICD-10-CM | POA: Diagnosis not present

## 2018-08-30 DIAGNOSIS — F5105 Insomnia due to other mental disorder: Secondary | ICD-10-CM | POA: Diagnosis not present

## 2018-08-30 DIAGNOSIS — F3175 Bipolar disorder, in partial remission, most recent episode depressed: Secondary | ICD-10-CM | POA: Diagnosis not present

## 2018-08-31 DIAGNOSIS — Z5181 Encounter for therapeutic drug level monitoring: Secondary | ICD-10-CM | POA: Diagnosis not present

## 2018-09-01 DIAGNOSIS — M170 Bilateral primary osteoarthritis of knee: Secondary | ICD-10-CM | POA: Diagnosis not present

## 2018-09-01 DIAGNOSIS — M25561 Pain in right knee: Secondary | ICD-10-CM | POA: Diagnosis not present

## 2018-09-01 DIAGNOSIS — F3175 Bipolar disorder, in partial remission, most recent episode depressed: Secondary | ICD-10-CM | POA: Diagnosis not present

## 2018-09-01 DIAGNOSIS — M25562 Pain in left knee: Secondary | ICD-10-CM | POA: Diagnosis not present

## 2018-09-02 DIAGNOSIS — M170 Bilateral primary osteoarthritis of knee: Secondary | ICD-10-CM | POA: Diagnosis not present

## 2018-09-08 DIAGNOSIS — I4510 Unspecified right bundle-branch block: Secondary | ICD-10-CM | POA: Diagnosis not present

## 2018-09-08 DIAGNOSIS — N3281 Overactive bladder: Secondary | ICD-10-CM | POA: Diagnosis not present

## 2018-09-08 DIAGNOSIS — R092 Respiratory arrest: Secondary | ICD-10-CM | POA: Diagnosis not present

## 2018-09-08 DIAGNOSIS — G629 Polyneuropathy, unspecified: Secondary | ICD-10-CM | POA: Diagnosis not present

## 2018-09-08 DIAGNOSIS — R300 Dysuria: Secondary | ICD-10-CM | POA: Diagnosis not present

## 2018-09-08 DIAGNOSIS — R441 Visual hallucinations: Secondary | ICD-10-CM | POA: Diagnosis not present

## 2018-09-09 DIAGNOSIS — Z5181 Encounter for therapeutic drug level monitoring: Secondary | ICD-10-CM | POA: Diagnosis not present

## 2018-09-09 DIAGNOSIS — D519 Vitamin B12 deficiency anemia, unspecified: Secondary | ICD-10-CM | POA: Diagnosis not present

## 2018-09-09 DIAGNOSIS — R569 Unspecified convulsions: Secondary | ICD-10-CM | POA: Diagnosis not present

## 2018-09-09 DIAGNOSIS — N390 Urinary tract infection, site not specified: Secondary | ICD-10-CM | POA: Diagnosis not present

## 2018-09-09 DIAGNOSIS — M2550 Pain in unspecified joint: Secondary | ICD-10-CM | POA: Diagnosis not present

## 2018-09-09 DIAGNOSIS — E039 Hypothyroidism, unspecified: Secondary | ICD-10-CM | POA: Diagnosis not present

## 2018-09-09 DIAGNOSIS — R319 Hematuria, unspecified: Secondary | ICD-10-CM | POA: Diagnosis not present

## 2018-09-09 DIAGNOSIS — D649 Anemia, unspecified: Secondary | ICD-10-CM | POA: Diagnosis not present

## 2018-09-09 DIAGNOSIS — E119 Type 2 diabetes mellitus without complications: Secondary | ICD-10-CM | POA: Diagnosis not present

## 2018-09-09 DIAGNOSIS — E559 Vitamin D deficiency, unspecified: Secondary | ICD-10-CM | POA: Diagnosis not present

## 2018-09-15 DIAGNOSIS — E559 Vitamin D deficiency, unspecified: Secondary | ICD-10-CM | POA: Diagnosis not present

## 2018-09-15 DIAGNOSIS — G20 Parkinson's disease: Secondary | ICD-10-CM | POA: Diagnosis not present

## 2018-09-15 DIAGNOSIS — G629 Polyneuropathy, unspecified: Secondary | ICD-10-CM | POA: Diagnosis not present

## 2018-09-15 DIAGNOSIS — R441 Visual hallucinations: Secondary | ICD-10-CM | POA: Diagnosis not present

## 2018-09-16 DIAGNOSIS — F3175 Bipolar disorder, in partial remission, most recent episode depressed: Secondary | ICD-10-CM | POA: Diagnosis not present

## 2018-09-27 DIAGNOSIS — F3175 Bipolar disorder, in partial remission, most recent episode depressed: Secondary | ICD-10-CM | POA: Diagnosis not present

## 2018-09-27 DIAGNOSIS — R441 Visual hallucinations: Secondary | ICD-10-CM | POA: Diagnosis not present

## 2018-09-27 DIAGNOSIS — F5105 Insomnia due to other mental disorder: Secondary | ICD-10-CM | POA: Diagnosis not present

## 2018-09-27 DIAGNOSIS — F064 Anxiety disorder due to known physiological condition: Secondary | ICD-10-CM | POA: Diagnosis not present

## 2018-09-27 DIAGNOSIS — G20 Parkinson's disease: Secondary | ICD-10-CM | POA: Diagnosis not present

## 2018-09-28 DIAGNOSIS — F3175 Bipolar disorder, in partial remission, most recent episode depressed: Secondary | ICD-10-CM | POA: Diagnosis not present

## 2018-09-30 DIAGNOSIS — K915 Postcholecystectomy syndrome: Secondary | ICD-10-CM | POA: Diagnosis not present

## 2018-09-30 DIAGNOSIS — M6281 Muscle weakness (generalized): Secondary | ICD-10-CM | POA: Diagnosis not present

## 2018-10-01 DIAGNOSIS — K915 Postcholecystectomy syndrome: Secondary | ICD-10-CM | POA: Diagnosis not present

## 2018-10-01 DIAGNOSIS — M6281 Muscle weakness (generalized): Secondary | ICD-10-CM | POA: Diagnosis not present

## 2018-10-04 DIAGNOSIS — K915 Postcholecystectomy syndrome: Secondary | ICD-10-CM | POA: Diagnosis not present

## 2018-10-04 DIAGNOSIS — M6281 Muscle weakness (generalized): Secondary | ICD-10-CM | POA: Diagnosis not present

## 2018-10-05 DIAGNOSIS — D518 Other vitamin B12 deficiency anemias: Secondary | ICD-10-CM | POA: Diagnosis not present

## 2018-10-05 DIAGNOSIS — K915 Postcholecystectomy syndrome: Secondary | ICD-10-CM | POA: Diagnosis not present

## 2018-10-05 DIAGNOSIS — E038 Other specified hypothyroidism: Secondary | ICD-10-CM | POA: Diagnosis not present

## 2018-10-05 DIAGNOSIS — E119 Type 2 diabetes mellitus without complications: Secondary | ICD-10-CM | POA: Diagnosis not present

## 2018-10-05 DIAGNOSIS — M6281 Muscle weakness (generalized): Secondary | ICD-10-CM | POA: Diagnosis not present

## 2018-10-06 DIAGNOSIS — M6281 Muscle weakness (generalized): Secondary | ICD-10-CM | POA: Diagnosis not present

## 2018-10-06 DIAGNOSIS — K915 Postcholecystectomy syndrome: Secondary | ICD-10-CM | POA: Diagnosis not present

## 2018-10-06 DIAGNOSIS — G250 Essential tremor: Secondary | ICD-10-CM | POA: Diagnosis not present

## 2018-10-06 DIAGNOSIS — G20 Parkinson's disease: Secondary | ICD-10-CM | POA: Diagnosis not present

## 2018-10-06 DIAGNOSIS — R441 Visual hallucinations: Secondary | ICD-10-CM | POA: Diagnosis not present

## 2018-10-06 DIAGNOSIS — J449 Chronic obstructive pulmonary disease, unspecified: Secondary | ICD-10-CM | POA: Diagnosis not present

## 2018-10-07 DIAGNOSIS — M6281 Muscle weakness (generalized): Secondary | ICD-10-CM | POA: Diagnosis not present

## 2018-10-07 DIAGNOSIS — K915 Postcholecystectomy syndrome: Secondary | ICD-10-CM | POA: Diagnosis not present

## 2018-10-08 DIAGNOSIS — R319 Hematuria, unspecified: Secondary | ICD-10-CM | POA: Diagnosis not present

## 2018-10-08 DIAGNOSIS — K915 Postcholecystectomy syndrome: Secondary | ICD-10-CM | POA: Diagnosis not present

## 2018-10-08 DIAGNOSIS — M6281 Muscle weakness (generalized): Secondary | ICD-10-CM | POA: Diagnosis not present

## 2018-10-08 DIAGNOSIS — N390 Urinary tract infection, site not specified: Secondary | ICD-10-CM | POA: Diagnosis not present

## 2018-10-09 DIAGNOSIS — K915 Postcholecystectomy syndrome: Secondary | ICD-10-CM | POA: Diagnosis not present

## 2018-10-09 DIAGNOSIS — M6281 Muscle weakness (generalized): Secondary | ICD-10-CM | POA: Diagnosis not present

## 2018-10-11 DIAGNOSIS — J449 Chronic obstructive pulmonary disease, unspecified: Secondary | ICD-10-CM | POA: Diagnosis not present

## 2018-10-11 DIAGNOSIS — G20 Parkinson's disease: Secondary | ICD-10-CM | POA: Diagnosis not present

## 2018-10-11 DIAGNOSIS — F319 Bipolar disorder, unspecified: Secondary | ICD-10-CM | POA: Diagnosis not present

## 2018-10-11 DIAGNOSIS — M6281 Muscle weakness (generalized): Secondary | ICD-10-CM | POA: Diagnosis not present

## 2018-10-11 DIAGNOSIS — R441 Visual hallucinations: Secondary | ICD-10-CM | POA: Diagnosis not present

## 2018-10-11 DIAGNOSIS — K915 Postcholecystectomy syndrome: Secondary | ICD-10-CM | POA: Diagnosis not present

## 2018-10-12 DIAGNOSIS — M6281 Muscle weakness (generalized): Secondary | ICD-10-CM | POA: Diagnosis not present

## 2018-10-12 DIAGNOSIS — K915 Postcholecystectomy syndrome: Secondary | ICD-10-CM | POA: Diagnosis not present

## 2018-10-12 DIAGNOSIS — F3175 Bipolar disorder, in partial remission, most recent episode depressed: Secondary | ICD-10-CM | POA: Diagnosis not present

## 2018-10-13 DIAGNOSIS — M6281 Muscle weakness (generalized): Secondary | ICD-10-CM | POA: Diagnosis not present

## 2018-10-13 DIAGNOSIS — K915 Postcholecystectomy syndrome: Secondary | ICD-10-CM | POA: Diagnosis not present

## 2018-10-14 DIAGNOSIS — M6281 Muscle weakness (generalized): Secondary | ICD-10-CM | POA: Diagnosis not present

## 2018-10-14 DIAGNOSIS — K915 Postcholecystectomy syndrome: Secondary | ICD-10-CM | POA: Diagnosis not present

## 2018-10-15 DIAGNOSIS — M6281 Muscle weakness (generalized): Secondary | ICD-10-CM | POA: Diagnosis not present

## 2018-10-15 DIAGNOSIS — K915 Postcholecystectomy syndrome: Secondary | ICD-10-CM | POA: Diagnosis not present

## 2018-10-18 DIAGNOSIS — K915 Postcholecystectomy syndrome: Secondary | ICD-10-CM | POA: Diagnosis not present

## 2018-10-18 DIAGNOSIS — D649 Anemia, unspecified: Secondary | ICD-10-CM | POA: Diagnosis not present

## 2018-10-18 DIAGNOSIS — I10 Essential (primary) hypertension: Secondary | ICD-10-CM | POA: Diagnosis not present

## 2018-10-18 DIAGNOSIS — E785 Hyperlipidemia, unspecified: Secondary | ICD-10-CM | POA: Diagnosis not present

## 2018-10-18 DIAGNOSIS — M6281 Muscle weakness (generalized): Secondary | ICD-10-CM | POA: Diagnosis not present

## 2018-10-18 DIAGNOSIS — R569 Unspecified convulsions: Secondary | ICD-10-CM | POA: Diagnosis not present

## 2018-10-18 DIAGNOSIS — Z79899 Other long term (current) drug therapy: Secondary | ICD-10-CM | POA: Diagnosis not present

## 2018-10-18 DIAGNOSIS — E559 Vitamin D deficiency, unspecified: Secondary | ICD-10-CM | POA: Diagnosis not present

## 2018-10-18 DIAGNOSIS — E039 Hypothyroidism, unspecified: Secondary | ICD-10-CM | POA: Diagnosis not present

## 2018-10-19 DIAGNOSIS — K915 Postcholecystectomy syndrome: Secondary | ICD-10-CM | POA: Diagnosis not present

## 2018-10-19 DIAGNOSIS — M6281 Muscle weakness (generalized): Secondary | ICD-10-CM | POA: Diagnosis not present

## 2018-10-20 DIAGNOSIS — K915 Postcholecystectomy syndrome: Secondary | ICD-10-CM | POA: Diagnosis not present

## 2018-10-20 DIAGNOSIS — M6281 Muscle weakness (generalized): Secondary | ICD-10-CM | POA: Diagnosis not present

## 2018-10-21 DIAGNOSIS — K915 Postcholecystectomy syndrome: Secondary | ICD-10-CM | POA: Diagnosis not present

## 2018-10-21 DIAGNOSIS — M6281 Muscle weakness (generalized): Secondary | ICD-10-CM | POA: Diagnosis not present

## 2018-10-22 DIAGNOSIS — K915 Postcholecystectomy syndrome: Secondary | ICD-10-CM | POA: Diagnosis not present

## 2018-10-22 DIAGNOSIS — M6281 Muscle weakness (generalized): Secondary | ICD-10-CM | POA: Diagnosis not present

## 2018-10-25 DIAGNOSIS — M6281 Muscle weakness (generalized): Secondary | ICD-10-CM | POA: Diagnosis not present

## 2018-10-25 DIAGNOSIS — K915 Postcholecystectomy syndrome: Secondary | ICD-10-CM | POA: Diagnosis not present

## 2018-10-26 DIAGNOSIS — R441 Visual hallucinations: Secondary | ICD-10-CM | POA: Diagnosis not present

## 2018-10-26 DIAGNOSIS — K915 Postcholecystectomy syndrome: Secondary | ICD-10-CM | POA: Diagnosis not present

## 2018-10-26 DIAGNOSIS — M6281 Muscle weakness (generalized): Secondary | ICD-10-CM | POA: Diagnosis not present

## 2018-10-26 DIAGNOSIS — M170 Bilateral primary osteoarthritis of knee: Secondary | ICD-10-CM | POA: Diagnosis not present

## 2018-10-26 DIAGNOSIS — F3175 Bipolar disorder, in partial remission, most recent episode depressed: Secondary | ICD-10-CM | POA: Diagnosis not present

## 2018-10-27 DIAGNOSIS — M6281 Muscle weakness (generalized): Secondary | ICD-10-CM | POA: Diagnosis not present

## 2018-10-27 DIAGNOSIS — K915 Postcholecystectomy syndrome: Secondary | ICD-10-CM | POA: Diagnosis not present

## 2018-10-28 DIAGNOSIS — K915 Postcholecystectomy syndrome: Secondary | ICD-10-CM | POA: Diagnosis not present

## 2018-10-28 DIAGNOSIS — M6281 Muscle weakness (generalized): Secondary | ICD-10-CM | POA: Diagnosis not present

## 2018-10-29 DIAGNOSIS — E611 Iron deficiency: Secondary | ICD-10-CM | POA: Diagnosis not present

## 2018-10-29 DIAGNOSIS — K915 Postcholecystectomy syndrome: Secondary | ICD-10-CM | POA: Diagnosis not present

## 2018-10-29 DIAGNOSIS — M6281 Muscle weakness (generalized): Secondary | ICD-10-CM | POA: Diagnosis not present

## 2018-10-29 DIAGNOSIS — D649 Anemia, unspecified: Secondary | ICD-10-CM | POA: Diagnosis not present

## 2018-10-29 DIAGNOSIS — F064 Anxiety disorder due to known physiological condition: Secondary | ICD-10-CM | POA: Diagnosis not present

## 2018-10-29 DIAGNOSIS — F3175 Bipolar disorder, in partial remission, most recent episode depressed: Secondary | ICD-10-CM | POA: Diagnosis not present

## 2018-10-29 DIAGNOSIS — G20 Parkinson's disease: Secondary | ICD-10-CM | POA: Diagnosis not present

## 2018-10-29 DIAGNOSIS — F5105 Insomnia due to other mental disorder: Secondary | ICD-10-CM | POA: Diagnosis not present

## 2018-11-01 DIAGNOSIS — K915 Postcholecystectomy syndrome: Secondary | ICD-10-CM | POA: Diagnosis not present

## 2018-11-01 DIAGNOSIS — D519 Vitamin B12 deficiency anemia, unspecified: Secondary | ICD-10-CM | POA: Diagnosis not present

## 2018-11-01 DIAGNOSIS — D649 Anemia, unspecified: Secondary | ICD-10-CM | POA: Diagnosis not present

## 2018-11-01 DIAGNOSIS — E611 Iron deficiency: Secondary | ICD-10-CM | POA: Diagnosis not present

## 2018-11-01 DIAGNOSIS — M6281 Muscle weakness (generalized): Secondary | ICD-10-CM | POA: Diagnosis not present

## 2018-11-02 DIAGNOSIS — M6281 Muscle weakness (generalized): Secondary | ICD-10-CM | POA: Diagnosis not present

## 2018-11-02 DIAGNOSIS — K915 Postcholecystectomy syndrome: Secondary | ICD-10-CM | POA: Diagnosis not present

## 2018-11-03 DIAGNOSIS — M6281 Muscle weakness (generalized): Secondary | ICD-10-CM | POA: Diagnosis not present

## 2018-11-03 DIAGNOSIS — K915 Postcholecystectomy syndrome: Secondary | ICD-10-CM | POA: Diagnosis not present

## 2018-11-04 DIAGNOSIS — K915 Postcholecystectomy syndrome: Secondary | ICD-10-CM | POA: Diagnosis not present

## 2018-11-04 DIAGNOSIS — M6281 Muscle weakness (generalized): Secondary | ICD-10-CM | POA: Diagnosis not present

## 2018-11-05 DIAGNOSIS — K915 Postcholecystectomy syndrome: Secondary | ICD-10-CM | POA: Diagnosis not present

## 2018-11-05 DIAGNOSIS — M6281 Muscle weakness (generalized): Secondary | ICD-10-CM | POA: Diagnosis not present

## 2018-11-08 DIAGNOSIS — K915 Postcholecystectomy syndrome: Secondary | ICD-10-CM | POA: Diagnosis not present

## 2018-11-08 DIAGNOSIS — M6281 Muscle weakness (generalized): Secondary | ICD-10-CM | POA: Diagnosis not present

## 2018-11-09 DIAGNOSIS — J449 Chronic obstructive pulmonary disease, unspecified: Secondary | ICD-10-CM | POA: Diagnosis not present

## 2018-11-09 DIAGNOSIS — L0390 Cellulitis, unspecified: Secondary | ICD-10-CM | POA: Diagnosis not present

## 2018-11-09 DIAGNOSIS — E785 Hyperlipidemia, unspecified: Secondary | ICD-10-CM | POA: Diagnosis not present

## 2018-11-09 DIAGNOSIS — E119 Type 2 diabetes mellitus without complications: Secondary | ICD-10-CM | POA: Diagnosis not present

## 2018-11-09 DIAGNOSIS — E038 Other specified hypothyroidism: Secondary | ICD-10-CM | POA: Diagnosis not present

## 2018-11-09 DIAGNOSIS — M6281 Muscle weakness (generalized): Secondary | ICD-10-CM | POA: Diagnosis not present

## 2018-11-09 DIAGNOSIS — D518 Other vitamin B12 deficiency anemias: Secondary | ICD-10-CM | POA: Diagnosis not present

## 2018-11-09 DIAGNOSIS — F3175 Bipolar disorder, in partial remission, most recent episode depressed: Secondary | ICD-10-CM | POA: Diagnosis not present

## 2018-11-09 DIAGNOSIS — K915 Postcholecystectomy syndrome: Secondary | ICD-10-CM | POA: Diagnosis not present

## 2018-11-10 DIAGNOSIS — M6281 Muscle weakness (generalized): Secondary | ICD-10-CM | POA: Diagnosis not present

## 2018-11-10 DIAGNOSIS — K915 Postcholecystectomy syndrome: Secondary | ICD-10-CM | POA: Diagnosis not present

## 2018-11-11 DIAGNOSIS — M6281 Muscle weakness (generalized): Secondary | ICD-10-CM | POA: Diagnosis not present

## 2018-11-11 DIAGNOSIS — K915 Postcholecystectomy syndrome: Secondary | ICD-10-CM | POA: Diagnosis not present

## 2018-11-18 ENCOUNTER — Other Ambulatory Visit: Payer: Self-pay

## 2018-11-22 DIAGNOSIS — H02834 Dermatochalasis of left upper eyelid: Secondary | ICD-10-CM | POA: Diagnosis not present

## 2018-11-22 DIAGNOSIS — G20 Parkinson's disease: Secondary | ICD-10-CM | POA: Diagnosis not present

## 2018-11-22 DIAGNOSIS — F3175 Bipolar disorder, in partial remission, most recent episode depressed: Secondary | ICD-10-CM | POA: Diagnosis not present

## 2018-11-22 DIAGNOSIS — H26493 Other secondary cataract, bilateral: Secondary | ICD-10-CM | POA: Diagnosis not present

## 2018-11-22 DIAGNOSIS — H02831 Dermatochalasis of right upper eyelid: Secondary | ICD-10-CM | POA: Diagnosis not present

## 2018-11-26 DIAGNOSIS — Z20828 Contact with and (suspected) exposure to other viral communicable diseases: Secondary | ICD-10-CM | POA: Diagnosis not present

## 2018-11-29 DIAGNOSIS — F319 Bipolar disorder, unspecified: Secondary | ICD-10-CM | POA: Diagnosis not present

## 2018-12-07 DIAGNOSIS — F319 Bipolar disorder, unspecified: Secondary | ICD-10-CM | POA: Diagnosis not present

## 2018-12-07 DIAGNOSIS — R569 Unspecified convulsions: Secondary | ICD-10-CM | POA: Diagnosis not present

## 2018-12-09 DIAGNOSIS — F3175 Bipolar disorder, in partial remission, most recent episode depressed: Secondary | ICD-10-CM | POA: Diagnosis not present

## 2018-12-10 DIAGNOSIS — Z20828 Contact with and (suspected) exposure to other viral communicable diseases: Secondary | ICD-10-CM | POA: Diagnosis not present

## 2018-12-13 DIAGNOSIS — E038 Other specified hypothyroidism: Secondary | ICD-10-CM | POA: Diagnosis not present

## 2018-12-13 DIAGNOSIS — D518 Other vitamin B12 deficiency anemias: Secondary | ICD-10-CM | POA: Diagnosis not present

## 2018-12-13 DIAGNOSIS — E119 Type 2 diabetes mellitus without complications: Secondary | ICD-10-CM | POA: Diagnosis not present

## 2018-12-18 DIAGNOSIS — Z20828 Contact with and (suspected) exposure to other viral communicable diseases: Secondary | ICD-10-CM | POA: Diagnosis not present

## 2018-12-21 DIAGNOSIS — F3175 Bipolar disorder, in partial remission, most recent episode depressed: Secondary | ICD-10-CM | POA: Diagnosis not present

## 2018-12-22 DIAGNOSIS — F319 Bipolar disorder, unspecified: Secondary | ICD-10-CM | POA: Diagnosis not present

## 2018-12-24 DIAGNOSIS — F3175 Bipolar disorder, in partial remission, most recent episode depressed: Secondary | ICD-10-CM | POA: Diagnosis not present

## 2018-12-24 DIAGNOSIS — G20 Parkinson's disease: Secondary | ICD-10-CM | POA: Diagnosis not present

## 2018-12-25 DIAGNOSIS — R918 Other nonspecific abnormal finding of lung field: Secondary | ICD-10-CM | POA: Diagnosis not present

## 2018-12-25 DIAGNOSIS — I10 Essential (primary) hypertension: Secondary | ICD-10-CM | POA: Diagnosis not present

## 2018-12-25 DIAGNOSIS — D649 Anemia, unspecified: Secondary | ICD-10-CM | POA: Diagnosis not present

## 2018-12-26 DIAGNOSIS — Z20828 Contact with and (suspected) exposure to other viral communicable diseases: Secondary | ICD-10-CM | POA: Diagnosis not present

## 2018-12-27 DIAGNOSIS — L0390 Cellulitis, unspecified: Secondary | ICD-10-CM | POA: Diagnosis not present

## 2018-12-27 DIAGNOSIS — F319 Bipolar disorder, unspecified: Secondary | ICD-10-CM | POA: Diagnosis not present

## 2018-12-27 DIAGNOSIS — J441 Chronic obstructive pulmonary disease with (acute) exacerbation: Secondary | ICD-10-CM | POA: Diagnosis present

## 2018-12-27 DIAGNOSIS — E039 Hypothyroidism, unspecified: Secondary | ICD-10-CM | POA: Diagnosis present

## 2018-12-27 DIAGNOSIS — J9601 Acute respiratory failure with hypoxia: Secondary | ICD-10-CM | POA: Diagnosis present

## 2018-12-27 DIAGNOSIS — G250 Essential tremor: Secondary | ICD-10-CM | POA: Diagnosis not present

## 2018-12-27 DIAGNOSIS — G894 Chronic pain syndrome: Secondary | ICD-10-CM | POA: Diagnosis present

## 2018-12-27 DIAGNOSIS — M1990 Unspecified osteoarthritis, unspecified site: Secondary | ICD-10-CM | POA: Diagnosis not present

## 2018-12-27 DIAGNOSIS — U071 COVID-19: Secondary | ICD-10-CM | POA: Diagnosis not present

## 2018-12-27 DIAGNOSIS — K5900 Constipation, unspecified: Secondary | ICD-10-CM | POA: Diagnosis not present

## 2018-12-27 DIAGNOSIS — I959 Hypotension, unspecified: Secondary | ICD-10-CM | POA: Diagnosis not present

## 2018-12-27 DIAGNOSIS — M6281 Muscle weakness (generalized): Secondary | ICD-10-CM | POA: Diagnosis not present

## 2018-12-27 DIAGNOSIS — K915 Postcholecystectomy syndrome: Secondary | ICD-10-CM | POA: Diagnosis not present

## 2018-12-27 DIAGNOSIS — I739 Peripheral vascular disease, unspecified: Secondary | ICD-10-CM | POA: Diagnosis not present

## 2018-12-27 DIAGNOSIS — F419 Anxiety disorder, unspecified: Secondary | ICD-10-CM | POA: Diagnosis present

## 2018-12-27 DIAGNOSIS — M436 Torticollis: Secondary | ICD-10-CM | POA: Diagnosis not present

## 2018-12-27 DIAGNOSIS — J9621 Acute and chronic respiratory failure with hypoxia: Secondary | ICD-10-CM | POA: Diagnosis not present

## 2018-12-27 DIAGNOSIS — J9611 Chronic respiratory failure with hypoxia: Secondary | ICD-10-CM | POA: Diagnosis not present

## 2018-12-27 DIAGNOSIS — N3281 Overactive bladder: Secondary | ICD-10-CM | POA: Diagnosis not present

## 2018-12-27 DIAGNOSIS — F329 Major depressive disorder, single episode, unspecified: Secondary | ICD-10-CM | POA: Diagnosis not present

## 2018-12-27 DIAGNOSIS — F314 Bipolar disorder, current episode depressed, severe, without psychotic features: Secondary | ICD-10-CM | POA: Diagnosis not present

## 2018-12-27 DIAGNOSIS — M25561 Pain in right knee: Secondary | ICD-10-CM | POA: Diagnosis not present

## 2018-12-27 DIAGNOSIS — J9612 Chronic respiratory failure with hypercapnia: Secondary | ICD-10-CM | POA: Diagnosis not present

## 2018-12-27 DIAGNOSIS — M170 Bilateral primary osteoarthritis of knee: Secondary | ICD-10-CM | POA: Diagnosis not present

## 2018-12-27 DIAGNOSIS — G629 Polyneuropathy, unspecified: Secondary | ICD-10-CM | POA: Diagnosis not present

## 2018-12-27 DIAGNOSIS — Z87891 Personal history of nicotine dependence: Secondary | ICD-10-CM | POA: Diagnosis not present

## 2018-12-27 DIAGNOSIS — R0902 Hypoxemia: Secondary | ICD-10-CM | POA: Diagnosis not present

## 2018-12-27 DIAGNOSIS — F5105 Insomnia due to other mental disorder: Secondary | ICD-10-CM | POA: Diagnosis not present

## 2018-12-27 DIAGNOSIS — J9811 Atelectasis: Secondary | ICD-10-CM | POA: Diagnosis not present

## 2018-12-27 DIAGNOSIS — J9602 Acute respiratory failure with hypercapnia: Secondary | ICD-10-CM | POA: Diagnosis present

## 2018-12-27 DIAGNOSIS — G8929 Other chronic pain: Secondary | ICD-10-CM | POA: Diagnosis not present

## 2018-12-27 DIAGNOSIS — Z9981 Dependence on supplemental oxygen: Secondary | ICD-10-CM | POA: Diagnosis not present

## 2018-12-27 DIAGNOSIS — H524 Presbyopia: Secondary | ICD-10-CM | POA: Diagnosis not present

## 2018-12-27 DIAGNOSIS — J9622 Acute and chronic respiratory failure with hypercapnia: Secondary | ICD-10-CM | POA: Diagnosis not present

## 2018-12-27 DIAGNOSIS — G20 Parkinson's disease: Secondary | ICD-10-CM | POA: Diagnosis present

## 2018-12-27 DIAGNOSIS — J449 Chronic obstructive pulmonary disease, unspecified: Secondary | ICD-10-CM | POA: Diagnosis not present

## 2018-12-27 DIAGNOSIS — R441 Visual hallucinations: Secondary | ICD-10-CM | POA: Diagnosis not present

## 2018-12-27 DIAGNOSIS — H04123 Dry eye syndrome of bilateral lacrimal glands: Secondary | ICD-10-CM | POA: Diagnosis not present

## 2018-12-27 DIAGNOSIS — M25562 Pain in left knee: Secondary | ICD-10-CM | POA: Diagnosis not present

## 2018-12-31 DIAGNOSIS — H04123 Dry eye syndrome of bilateral lacrimal glands: Secondary | ICD-10-CM | POA: Diagnosis not present

## 2018-12-31 DIAGNOSIS — R918 Other nonspecific abnormal finding of lung field: Secondary | ICD-10-CM | POA: Diagnosis not present

## 2018-12-31 DIAGNOSIS — Z7401 Bed confinement status: Secondary | ICD-10-CM | POA: Diagnosis not present

## 2018-12-31 DIAGNOSIS — R441 Visual hallucinations: Secondary | ICD-10-CM | POA: Diagnosis not present

## 2018-12-31 DIAGNOSIS — I739 Peripheral vascular disease, unspecified: Secondary | ICD-10-CM | POA: Diagnosis not present

## 2018-12-31 DIAGNOSIS — M436 Torticollis: Secondary | ICD-10-CM | POA: Diagnosis not present

## 2018-12-31 DIAGNOSIS — F5105 Insomnia due to other mental disorder: Secondary | ICD-10-CM | POA: Diagnosis not present

## 2018-12-31 DIAGNOSIS — N3281 Overactive bladder: Secondary | ICD-10-CM | POA: Diagnosis not present

## 2018-12-31 DIAGNOSIS — J1289 Other viral pneumonia: Secondary | ICD-10-CM | POA: Diagnosis not present

## 2018-12-31 DIAGNOSIS — U071 COVID-19: Secondary | ICD-10-CM | POA: Diagnosis not present

## 2018-12-31 DIAGNOSIS — F329 Major depressive disorder, single episode, unspecified: Secondary | ICD-10-CM | POA: Diagnosis not present

## 2018-12-31 DIAGNOSIS — J9811 Atelectasis: Secondary | ICD-10-CM | POA: Diagnosis not present

## 2018-12-31 DIAGNOSIS — E039 Hypothyroidism, unspecified: Secondary | ICD-10-CM | POA: Diagnosis not present

## 2018-12-31 DIAGNOSIS — Z9981 Dependence on supplemental oxygen: Secondary | ICD-10-CM | POA: Diagnosis not present

## 2018-12-31 DIAGNOSIS — J9612 Chronic respiratory failure with hypercapnia: Secondary | ICD-10-CM | POA: Diagnosis not present

## 2018-12-31 DIAGNOSIS — K5900 Constipation, unspecified: Secondary | ICD-10-CM | POA: Diagnosis not present

## 2018-12-31 DIAGNOSIS — M25561 Pain in right knee: Secondary | ICD-10-CM | POA: Diagnosis not present

## 2018-12-31 DIAGNOSIS — R41841 Cognitive communication deficit: Secondary | ICD-10-CM | POA: Diagnosis not present

## 2018-12-31 DIAGNOSIS — J9602 Acute respiratory failure with hypercapnia: Secondary | ICD-10-CM | POA: Diagnosis not present

## 2018-12-31 DIAGNOSIS — R0902 Hypoxemia: Secondary | ICD-10-CM | POA: Diagnosis not present

## 2018-12-31 DIAGNOSIS — F319 Bipolar disorder, unspecified: Secondary | ICD-10-CM | POA: Diagnosis not present

## 2018-12-31 DIAGNOSIS — I959 Hypotension, unspecified: Secondary | ICD-10-CM | POA: Diagnosis not present

## 2018-12-31 DIAGNOSIS — R1312 Dysphagia, oropharyngeal phase: Secondary | ICD-10-CM | POA: Diagnosis not present

## 2018-12-31 DIAGNOSIS — H524 Presbyopia: Secondary | ICD-10-CM | POA: Diagnosis not present

## 2018-12-31 DIAGNOSIS — J9621 Acute and chronic respiratory failure with hypoxia: Secondary | ICD-10-CM | POA: Diagnosis not present

## 2018-12-31 DIAGNOSIS — F314 Bipolar disorder, current episode depressed, severe, without psychotic features: Secondary | ICD-10-CM | POA: Diagnosis not present

## 2018-12-31 DIAGNOSIS — G20 Parkinson's disease: Secondary | ICD-10-CM | POA: Diagnosis not present

## 2018-12-31 DIAGNOSIS — J449 Chronic obstructive pulmonary disease, unspecified: Secondary | ICD-10-CM | POA: Diagnosis not present

## 2018-12-31 DIAGNOSIS — J9601 Acute respiratory failure with hypoxia: Secondary | ICD-10-CM | POA: Diagnosis not present

## 2018-12-31 DIAGNOSIS — M170 Bilateral primary osteoarthritis of knee: Secondary | ICD-10-CM | POA: Diagnosis not present

## 2018-12-31 DIAGNOSIS — F419 Anxiety disorder, unspecified: Secondary | ICD-10-CM | POA: Diagnosis not present

## 2018-12-31 DIAGNOSIS — R262 Difficulty in walking, not elsewhere classified: Secondary | ICD-10-CM | POA: Diagnosis not present

## 2018-12-31 DIAGNOSIS — M25562 Pain in left knee: Secondary | ICD-10-CM | POA: Diagnosis not present

## 2018-12-31 DIAGNOSIS — J9611 Chronic respiratory failure with hypoxia: Secondary | ICD-10-CM | POA: Diagnosis not present

## 2018-12-31 DIAGNOSIS — B9562 Methicillin resistant Staphylococcus aureus infection as the cause of diseases classified elsewhere: Secondary | ICD-10-CM | POA: Diagnosis not present

## 2018-12-31 DIAGNOSIS — J441 Chronic obstructive pulmonary disease with (acute) exacerbation: Secondary | ICD-10-CM | POA: Diagnosis not present

## 2018-12-31 DIAGNOSIS — J9622 Acute and chronic respiratory failure with hypercapnia: Secondary | ICD-10-CM | POA: Diagnosis not present

## 2018-12-31 DIAGNOSIS — K915 Postcholecystectomy syndrome: Secondary | ICD-10-CM | POA: Diagnosis not present

## 2018-12-31 DIAGNOSIS — G250 Essential tremor: Secondary | ICD-10-CM | POA: Diagnosis not present

## 2018-12-31 DIAGNOSIS — Z87891 Personal history of nicotine dependence: Secondary | ICD-10-CM | POA: Diagnosis not present

## 2018-12-31 DIAGNOSIS — G894 Chronic pain syndrome: Secondary | ICD-10-CM | POA: Diagnosis present

## 2018-12-31 DIAGNOSIS — M6281 Muscle weakness (generalized): Secondary | ICD-10-CM | POA: Diagnosis not present

## 2018-12-31 DIAGNOSIS — G629 Polyneuropathy, unspecified: Secondary | ICD-10-CM | POA: Diagnosis not present

## 2019-01-10 DIAGNOSIS — H04123 Dry eye syndrome of bilateral lacrimal glands: Secondary | ICD-10-CM | POA: Diagnosis not present

## 2019-01-10 DIAGNOSIS — M170 Bilateral primary osteoarthritis of knee: Secondary | ICD-10-CM | POA: Diagnosis not present

## 2019-01-10 DIAGNOSIS — M25562 Pain in left knee: Secondary | ICD-10-CM | POA: Diagnosis not present

## 2019-01-10 DIAGNOSIS — J9611 Chronic respiratory failure with hypoxia: Secondary | ICD-10-CM | POA: Diagnosis not present

## 2019-01-10 DIAGNOSIS — J9601 Acute respiratory failure with hypoxia: Secondary | ICD-10-CM | POA: Diagnosis not present

## 2019-01-10 DIAGNOSIS — E038 Other specified hypothyroidism: Secondary | ICD-10-CM | POA: Diagnosis not present

## 2019-01-10 DIAGNOSIS — R441 Visual hallucinations: Secondary | ICD-10-CM | POA: Diagnosis not present

## 2019-01-10 DIAGNOSIS — D518 Other vitamin B12 deficiency anemias: Secondary | ICD-10-CM | POA: Diagnosis not present

## 2019-01-10 DIAGNOSIS — E039 Hypothyroidism, unspecified: Secondary | ICD-10-CM | POA: Diagnosis not present

## 2019-01-10 DIAGNOSIS — N3281 Overactive bladder: Secondary | ICD-10-CM | POA: Diagnosis not present

## 2019-01-10 DIAGNOSIS — G20 Parkinson's disease: Secondary | ICD-10-CM | POA: Diagnosis not present

## 2019-01-10 DIAGNOSIS — K5900 Constipation, unspecified: Secondary | ICD-10-CM | POA: Diagnosis not present

## 2019-01-10 DIAGNOSIS — F319 Bipolar disorder, unspecified: Secondary | ICD-10-CM | POA: Diagnosis not present

## 2019-01-10 DIAGNOSIS — F3175 Bipolar disorder, in partial remission, most recent episode depressed: Secondary | ICD-10-CM | POA: Diagnosis not present

## 2019-01-10 DIAGNOSIS — R41841 Cognitive communication deficit: Secondary | ICD-10-CM | POA: Diagnosis not present

## 2019-01-10 DIAGNOSIS — R0902 Hypoxemia: Secondary | ICD-10-CM | POA: Diagnosis not present

## 2019-01-10 DIAGNOSIS — I739 Peripheral vascular disease, unspecified: Secondary | ICD-10-CM | POA: Diagnosis not present

## 2019-01-10 DIAGNOSIS — K915 Postcholecystectomy syndrome: Secondary | ICD-10-CM | POA: Diagnosis not present

## 2019-01-10 DIAGNOSIS — F314 Bipolar disorder, current episode depressed, severe, without psychotic features: Secondary | ICD-10-CM | POA: Diagnosis not present

## 2019-01-10 DIAGNOSIS — M436 Torticollis: Secondary | ICD-10-CM | POA: Diagnosis not present

## 2019-01-10 DIAGNOSIS — J9612 Chronic respiratory failure with hypercapnia: Secondary | ICD-10-CM | POA: Diagnosis not present

## 2019-01-10 DIAGNOSIS — R1312 Dysphagia, oropharyngeal phase: Secondary | ICD-10-CM | POA: Diagnosis not present

## 2019-01-10 DIAGNOSIS — J449 Chronic obstructive pulmonary disease, unspecified: Secondary | ICD-10-CM | POA: Diagnosis not present

## 2019-01-10 DIAGNOSIS — H524 Presbyopia: Secondary | ICD-10-CM | POA: Diagnosis not present

## 2019-01-10 DIAGNOSIS — F329 Major depressive disorder, single episode, unspecified: Secondary | ICD-10-CM | POA: Diagnosis not present

## 2019-01-10 DIAGNOSIS — E119 Type 2 diabetes mellitus without complications: Secondary | ICD-10-CM | POA: Diagnosis not present

## 2019-01-10 DIAGNOSIS — G250 Essential tremor: Secondary | ICD-10-CM | POA: Diagnosis not present

## 2019-01-10 DIAGNOSIS — F5105 Insomnia due to other mental disorder: Secondary | ICD-10-CM | POA: Diagnosis not present

## 2019-01-10 DIAGNOSIS — G629 Polyneuropathy, unspecified: Secondary | ICD-10-CM | POA: Diagnosis not present

## 2019-01-10 DIAGNOSIS — R262 Difficulty in walking, not elsewhere classified: Secondary | ICD-10-CM | POA: Diagnosis not present

## 2019-01-10 DIAGNOSIS — Z7401 Bed confinement status: Secondary | ICD-10-CM | POA: Diagnosis not present

## 2019-01-10 DIAGNOSIS — M6281 Muscle weakness (generalized): Secondary | ICD-10-CM | POA: Diagnosis not present

## 2019-01-10 DIAGNOSIS — J9602 Acute respiratory failure with hypercapnia: Secondary | ICD-10-CM | POA: Diagnosis not present

## 2019-01-10 DIAGNOSIS — M25561 Pain in right knee: Secondary | ICD-10-CM | POA: Diagnosis not present

## 2019-01-10 DIAGNOSIS — U071 COVID-19: Secondary | ICD-10-CM | POA: Diagnosis not present

## 2019-01-18 DIAGNOSIS — F3175 Bipolar disorder, in partial remission, most recent episode depressed: Secondary | ICD-10-CM | POA: Diagnosis not present

## 2019-01-19 DIAGNOSIS — E038 Other specified hypothyroidism: Secondary | ICD-10-CM | POA: Diagnosis not present

## 2019-01-19 DIAGNOSIS — E119 Type 2 diabetes mellitus without complications: Secondary | ICD-10-CM | POA: Diagnosis not present

## 2019-01-19 DIAGNOSIS — D518 Other vitamin B12 deficiency anemias: Secondary | ICD-10-CM | POA: Diagnosis not present

## 2019-01-20 ENCOUNTER — Other Ambulatory Visit: Payer: Self-pay | Admitting: *Deleted

## 2019-01-20 NOTE — Patient Outreach (Signed)
Member assessed for potential [facility] Care Management needs as a benefit of  [place] Medicare.  Member is currently receiving rehab therapy at [facility].  Member discussed in weekly telephonic IDT meeting with facility staff, [facility] UM team, and writer.  Facility reports member will transition back to long term care at [facility] upon SNF dc.   Will continue to follow for potential [facility] Care Management needs.   Marthenia Rolling, MSN-Ed, RN,BSN [place] Acute Care Coordinator [phone] [facility]) [phone]  (Toll free office)

## 2019-01-27 ENCOUNTER — Other Ambulatory Visit: Payer: Self-pay | Admitting: *Deleted

## 2019-01-28 NOTE — Patient Outreach (Signed)
Late entry for 01/27/19  Member assessed for potential [facility] Care Management needs as a benefit of  [place] Medicare.  Member is currently receiving rehab therapy at [facility].  Member discussed in weekly telephonic IDT meeting with  facility staff, [facility] UM team, and writer.  Member will remain at facility for long term.  No identifiable [facility] Care Management needs at this time.   Marthenia Rolling, MSN-Ed, RN,BSN [place] Acute Care Coordinator [phone] [facility]) [phone]  (Toll free office)

## 2019-02-08 DIAGNOSIS — E119 Type 2 diabetes mellitus without complications: Secondary | ICD-10-CM | POA: Diagnosis not present

## 2019-02-08 DIAGNOSIS — D518 Other vitamin B12 deficiency anemias: Secondary | ICD-10-CM | POA: Diagnosis not present

## 2019-02-08 DIAGNOSIS — E038 Other specified hypothyroidism: Secondary | ICD-10-CM | POA: Diagnosis not present

## 2019-02-09 DIAGNOSIS — R1312 Dysphagia, oropharyngeal phase: Secondary | ICD-10-CM | POA: Diagnosis not present

## 2019-02-09 DIAGNOSIS — M6281 Muscle weakness (generalized): Secondary | ICD-10-CM | POA: Diagnosis not present

## 2019-02-09 DIAGNOSIS — R262 Difficulty in walking, not elsewhere classified: Secondary | ICD-10-CM | POA: Diagnosis not present

## 2019-02-09 DIAGNOSIS — R41841 Cognitive communication deficit: Secondary | ICD-10-CM | POA: Diagnosis not present

## 2019-02-09 DIAGNOSIS — G20 Parkinson's disease: Secondary | ICD-10-CM | POA: Diagnosis not present

## 2019-02-10 DIAGNOSIS — M6281 Muscle weakness (generalized): Secondary | ICD-10-CM | POA: Diagnosis not present

## 2019-02-10 DIAGNOSIS — R1312 Dysphagia, oropharyngeal phase: Secondary | ICD-10-CM | POA: Diagnosis not present

## 2019-02-10 DIAGNOSIS — R262 Difficulty in walking, not elsewhere classified: Secondary | ICD-10-CM | POA: Diagnosis not present

## 2019-02-10 DIAGNOSIS — G20 Parkinson's disease: Secondary | ICD-10-CM | POA: Diagnosis not present

## 2019-02-10 DIAGNOSIS — R41841 Cognitive communication deficit: Secondary | ICD-10-CM | POA: Diagnosis not present

## 2019-02-11 DIAGNOSIS — R1312 Dysphagia, oropharyngeal phase: Secondary | ICD-10-CM | POA: Diagnosis not present

## 2019-02-11 DIAGNOSIS — M6281 Muscle weakness (generalized): Secondary | ICD-10-CM | POA: Diagnosis not present

## 2019-02-11 DIAGNOSIS — R262 Difficulty in walking, not elsewhere classified: Secondary | ICD-10-CM | POA: Diagnosis not present

## 2019-02-11 DIAGNOSIS — R41841 Cognitive communication deficit: Secondary | ICD-10-CM | POA: Diagnosis not present

## 2019-02-11 DIAGNOSIS — G20 Parkinson's disease: Secondary | ICD-10-CM | POA: Diagnosis not present

## 2019-02-14 DIAGNOSIS — G20 Parkinson's disease: Secondary | ICD-10-CM | POA: Diagnosis not present

## 2019-02-14 DIAGNOSIS — M6281 Muscle weakness (generalized): Secondary | ICD-10-CM | POA: Diagnosis not present

## 2019-02-14 DIAGNOSIS — R41841 Cognitive communication deficit: Secondary | ICD-10-CM | POA: Diagnosis not present

## 2019-02-14 DIAGNOSIS — R262 Difficulty in walking, not elsewhere classified: Secondary | ICD-10-CM | POA: Diagnosis not present

## 2019-02-14 DIAGNOSIS — R1312 Dysphagia, oropharyngeal phase: Secondary | ICD-10-CM | POA: Diagnosis not present

## 2019-02-15 DIAGNOSIS — R262 Difficulty in walking, not elsewhere classified: Secondary | ICD-10-CM | POA: Diagnosis not present

## 2019-02-15 DIAGNOSIS — G20 Parkinson's disease: Secondary | ICD-10-CM | POA: Diagnosis not present

## 2019-02-15 DIAGNOSIS — R1312 Dysphagia, oropharyngeal phase: Secondary | ICD-10-CM | POA: Diagnosis not present

## 2019-02-15 DIAGNOSIS — R41841 Cognitive communication deficit: Secondary | ICD-10-CM | POA: Diagnosis not present

## 2019-02-15 DIAGNOSIS — M6281 Muscle weakness (generalized): Secondary | ICD-10-CM | POA: Diagnosis not present

## 2019-02-15 DIAGNOSIS — F3175 Bipolar disorder, in partial remission, most recent episode depressed: Secondary | ICD-10-CM | POA: Diagnosis not present

## 2019-02-16 DIAGNOSIS — R262 Difficulty in walking, not elsewhere classified: Secondary | ICD-10-CM | POA: Diagnosis not present

## 2019-02-16 DIAGNOSIS — R1312 Dysphagia, oropharyngeal phase: Secondary | ICD-10-CM | POA: Diagnosis not present

## 2019-02-16 DIAGNOSIS — M6281 Muscle weakness (generalized): Secondary | ICD-10-CM | POA: Diagnosis not present

## 2019-02-16 DIAGNOSIS — R41841 Cognitive communication deficit: Secondary | ICD-10-CM | POA: Diagnosis not present

## 2019-02-16 DIAGNOSIS — G20 Parkinson's disease: Secondary | ICD-10-CM | POA: Diagnosis not present

## 2019-02-17 DIAGNOSIS — R1312 Dysphagia, oropharyngeal phase: Secondary | ICD-10-CM | POA: Diagnosis not present

## 2019-02-17 DIAGNOSIS — G20 Parkinson's disease: Secondary | ICD-10-CM | POA: Diagnosis not present

## 2019-02-17 DIAGNOSIS — R262 Difficulty in walking, not elsewhere classified: Secondary | ICD-10-CM | POA: Diagnosis not present

## 2019-02-17 DIAGNOSIS — M6281 Muscle weakness (generalized): Secondary | ICD-10-CM | POA: Diagnosis not present

## 2019-02-17 DIAGNOSIS — R41841 Cognitive communication deficit: Secondary | ICD-10-CM | POA: Diagnosis not present

## 2019-02-18 DIAGNOSIS — R262 Difficulty in walking, not elsewhere classified: Secondary | ICD-10-CM | POA: Diagnosis not present

## 2019-02-18 DIAGNOSIS — M6281 Muscle weakness (generalized): Secondary | ICD-10-CM | POA: Diagnosis not present

## 2019-02-18 DIAGNOSIS — G20 Parkinson's disease: Secondary | ICD-10-CM | POA: Diagnosis not present

## 2019-02-18 DIAGNOSIS — R1312 Dysphagia, oropharyngeal phase: Secondary | ICD-10-CM | POA: Diagnosis not present

## 2019-02-18 DIAGNOSIS — R41841 Cognitive communication deficit: Secondary | ICD-10-CM | POA: Diagnosis not present

## 2019-02-20 DIAGNOSIS — G20 Parkinson's disease: Secondary | ICD-10-CM | POA: Diagnosis not present

## 2019-02-22 DIAGNOSIS — G20 Parkinson's disease: Secondary | ICD-10-CM | POA: Diagnosis not present

## 2019-02-23 DIAGNOSIS — G20 Parkinson's disease: Secondary | ICD-10-CM | POA: Diagnosis not present

## 2019-02-24 DIAGNOSIS — G20 Parkinson's disease: Secondary | ICD-10-CM | POA: Diagnosis not present

## 2019-02-25 DIAGNOSIS — G20 Parkinson's disease: Secondary | ICD-10-CM | POA: Diagnosis not present

## 2019-02-28 DIAGNOSIS — G20 Parkinson's disease: Secondary | ICD-10-CM | POA: Diagnosis not present

## 2019-03-01 DIAGNOSIS — G20 Parkinson's disease: Secondary | ICD-10-CM | POA: Diagnosis not present

## 2019-03-02 DIAGNOSIS — G20 Parkinson's disease: Secondary | ICD-10-CM | POA: Diagnosis not present

## 2019-03-03 DIAGNOSIS — G20 Parkinson's disease: Secondary | ICD-10-CM | POA: Diagnosis not present

## 2019-03-04 DIAGNOSIS — G20 Parkinson's disease: Secondary | ICD-10-CM | POA: Diagnosis not present

## 2019-03-08 DIAGNOSIS — U071 COVID-19: Secondary | ICD-10-CM | POA: Diagnosis not present

## 2019-03-08 DIAGNOSIS — F319 Bipolar disorder, unspecified: Secondary | ICD-10-CM | POA: Diagnosis not present

## 2019-03-10 DIAGNOSIS — E038 Other specified hypothyroidism: Secondary | ICD-10-CM | POA: Diagnosis not present

## 2019-03-10 DIAGNOSIS — D518 Other vitamin B12 deficiency anemias: Secondary | ICD-10-CM | POA: Diagnosis not present

## 2019-03-10 DIAGNOSIS — E119 Type 2 diabetes mellitus without complications: Secondary | ICD-10-CM | POA: Diagnosis not present

## 2019-03-15 DIAGNOSIS — F3175 Bipolar disorder, in partial remission, most recent episode depressed: Secondary | ICD-10-CM | POA: Diagnosis not present

## 2019-03-16 DIAGNOSIS — G20 Parkinson's disease: Secondary | ICD-10-CM | POA: Diagnosis not present

## 2019-03-16 DIAGNOSIS — F3175 Bipolar disorder, in partial remission, most recent episode depressed: Secondary | ICD-10-CM | POA: Diagnosis not present

## 2019-12-18 ENCOUNTER — Encounter: Payer: Self-pay | Admitting: Internal Medicine

## 2020-01-06 ENCOUNTER — Ambulatory Visit: Payer: Medicare PPO | Admitting: Urology

## 2020-01-18 ENCOUNTER — Other Ambulatory Visit: Payer: Self-pay

## 2020-01-18 ENCOUNTER — Encounter: Payer: Self-pay | Admitting: Internal Medicine

## 2020-01-18 ENCOUNTER — Ambulatory Visit (INDEPENDENT_AMBULATORY_CARE_PROVIDER_SITE_OTHER): Payer: Medicare Other | Admitting: Internal Medicine

## 2020-01-18 VITALS — BP 107/63 | HR 75 | Temp 96.5°F | Ht 65.0 in

## 2020-01-18 DIAGNOSIS — A0471 Enterocolitis due to Clostridium difficile, recurrent: Secondary | ICD-10-CM | POA: Diagnosis not present

## 2020-01-18 DIAGNOSIS — A09 Infectious gastroenteritis and colitis, unspecified: Secondary | ICD-10-CM

## 2020-01-18 DIAGNOSIS — K625 Hemorrhage of anus and rectum: Secondary | ICD-10-CM

## 2020-01-18 NOTE — Progress Notes (Signed)
Primary Care Physician:  Toma DeitersHasanaj, Xaje A, MD Primary Gastroenterologist:  Dr. Marletta Lorarver  Chief Complaint  Patient presents with  . Diarrhea    reoccuring c dif    HPI:   Kristen PapDorothy Sanchez is a 79 y.o. female who presents to the clinic today by referral from the [facility] for recurrent C. difficile.  Patient is accompanied by her daughter.  Based on what they tell me, patient has had 2 episodes of C. difficile colitis.Marland Kitchen.  She was admitted to the hospital 12/08/2019 for C. difficile colitis.  She states that she has been placed on a 6-week course of antibiotics both times.  Based on her med list looks like she has been using vancomycin and metronidazole.  I [facility] with [facility] who states that patient's diarrhea has improved.  States she is averaging 3 bowel movements daily.  Patient does note some intermittent rectal bleeding.  Denies any abdominal pain.  Patient agrees that she feels as though she is improved.  Patient reports her last colonoscopy was greater than 10 years ago though I do not have access to this report.  Past Medical History:  Diagnosis Date  . Arthritis   . Bipolar disorder (HCC)   . COPD (chronic obstructive pulmonary disease) (HCC)   . High cholesterol   . Hypothyroidism   . Thyroid disease   . UTI (lower urinary tract infection)     Past Surgical History:  Procedure Laterality Date  . CATARACT EXTRACTION W/PHACO Right 11/14/2013   Procedure: CATARACT EXTRACTION PHACO AND INTRAOCULAR LENS PLACEMENT RIGHT EYE CDE=14.26;  Surgeon: Gemma PayorKerry Hunt, MD;  Location: [facility];  Service: Ophthalmology;  Laterality: Right;  . CATARACT EXTRACTION W/PHACO Left 11/28/2013   Procedure: CATARACT EXTRACTION PHACO AND INTRAOCULAR LENS PLACEMENT (IOC);  Surgeon: Gemma PayorKerry Hunt, MD;  Location: [facility];  Service: Ophthalmology;  Laterality: Left;  CDE 8.65  . EXTERNAL FIXATION WRIST FRACTURE Right    [place]- 10/2013    Current Outpatient Medications  Medication Sig  Dispense Refill  . albuterol (PROVENTIL HFA;VENTOLIN HFA) 108 (90 BASE) MCG/ACT inhaler Inhale 1-2 puffs into the lungs every 6 (six) hours as needed for wheezing or shortness of breath.    Marland Kitchen. alendronate (FOSAMAX) 70 MG tablet Take 70 mg by mouth once a week. Take with a full glass of water on an empty stomach.    . Ascorbic Acid (VITAMIN C) 500 MG CAPS Take by mouth daily.    . carbidopa-levodopa (SINEMET IR) 25-100 MG tablet Take 1 tablet by mouth 3 (three) times daily.    . divalproex (DEPAKOTE) 250 MG DR tablet Take 250 mg by mouth 3 (three) times daily.    . ferrous sulfate 325 (65 FE) MG tablet Take 325 mg by mouth in the morning and at bedtime.    . fluticasone (FLOVENT DISKUS) 50 MCG/BLIST diskus inhaler Inhale 1 puff into the lungs 2 (two) times daily.    . folic acid (FOLVITE) 400 MCG tablet Take 400 mcg by mouth daily. Takes 2 tablets in the morning.    . gabapentin (NEURONTIN) 300 MG capsule Take 300 mg by mouth 3 (three) times daily.    Marland Kitchen. HYDROcodone-acetaminophen (NORCO/VICODIN) 5-325 MG tablet Take 1 tablet by mouth every 8 (eight) hours as needed for moderate pain.    Marland Kitchen. levothyroxine (SYNTHROID, LEVOTHROID) 125 MCG tablet Take 137 mcg by mouth daily before breakfast.     . loratadine (CLARITIN) 10 MG tablet Take 10 mg by mouth daily.    .Marland Kitchen  melatonin 3 MG TABS tablet Take 3 mg by mouth at bedtime.    . mirabegron ER (MYRBETRIQ) 50 MG TB24 tablet Take 50 mg by mouth daily.    . mirtazapine (REMERON) 15 MG tablet Take 15 mg by mouth at bedtime.    . Multiple Vitamin (MULTIVITAMIN ADULT PO) Take by mouth daily.    Marland Kitchen PARoxetine (PAXIL) 10 MG tablet Take 5 mg by mouth daily. Taking a 5mg  tablet and a 20mg  tablet every morning per MAR.    . primidone (MYSOLINE) 50 MG tablet Take 50 mg by mouth 4 (four) times daily.    . vancomycin (VANCOCIN) 125 MG capsule Take 125 mg by mouth 2 (two) times daily.    . Zinc 50 MG TABS Take by mouth daily.    ALPRAZolam (XANAX) 0.5 MG tablet Take 0.5 mg  by mouth at bedtime as needed for sleep or anxiety. (Patient not taking: Reported on 01/18/2020)    . atorvastatin (LIPITOR) 10 MG tablet Take 10 mg by mouth daily.  (Patient not taking: Reported on 01/18/2020)    . beclomethasone (QVAR) 80 MCG/ACT inhaler Inhale 1 puff into the lungs 2 (two) times daily. (Patient not taking: Reported on 01/18/2020)    . calcium-vitamin D (OSCAL WITH D) 500-200 MG-UNIT per tablet Take 1 tablet by mouth daily with breakfast. (Patient not taking: Reported on 01/18/2020)    . cholecalciferol (VITAMIN D) 1000 UNITS tablet Take 1,000 Units by mouth daily. (Patient not taking: Reported on 01/18/2020)    . furosemide (LASIX) 20 MG tablet Take 20 mg by mouth 2 (two) times a week. (Patient not taking: Reported on 01/18/2020)    . lamoTRIgine (LAMICTAL) 25 MG tablet Take 50 mg by mouth 2 (two) times daily.  (Patient not taking: Reported on 01/18/2020)    . metroNIDAZOLE (FLAGYL) 500 MG tablet Take 500 mg by mouth 3 (three) times daily. (Patient not taking: Reported on 01/18/2020)    . Omega-3 Fatty Acids (FISH OIL PO) Take 1 capsule by mouth daily. (Patient not taking: Reported on 01/18/2020)    . vancomycin (VANCOCIN) 50 mg/mL oral solution Take 125 mg by mouth every 6 (six) hours. Takes 2.5 ml in the morning every 7 days. (Patient not taking: Reported on 01/18/2020)     No current facility-administered medications for this visit.    Allergies as of 01/18/2020  . (No Known Allergies)    No family history on file.  Social History   Socioeconomic History  . Marital status: Widowed    Spouse name: Not on file  . Number of children: Not on file  . Years of education: Not on file  . Highest education level: Not on file  Occupational History  . Not on file  Tobacco Use  . Smoking status: Former Smoker    Packs/day: 1.00    Years: 50.00    Pack years: 50.00    Types: Cigarettes  . Smokeless tobacco: Never Used  Substance and Sexual Activity  . Alcohol use: No  . Drug  use: No  . Sexual activity: Yes    Birth control/protection: Post-menopausal  Other Topics Concern  . Not on file  Social History Narrative  . Not on file   Social Determinants of Health   Financial Resource Strain:   . Difficulty of Paying Living Expenses: Not on file  Food Insecurity:   . Worried About 01/20/2020 in the Last Year: Not on file  . Ran Out of Food in  the Last Year: Not on file  Transportation Needs:   . Lack of Transportation (Medical): Not on file  . Lack of Transportation (Non-Medical): Not on file  Physical Activity:   . Days of Exercise per Week: Not on file  . Minutes of Exercise per Session: Not on file  Stress:   . Feeling of Stress : Not on file  Social Connections:   . Frequency of Communication with Friends and Family: Not on file  . Frequency of Social Gatherings with Friends and Family: Not on file  . Attends Religious Services: Not on file  . Active Member of Clubs or Organizations: Not on file  . Attends Banker Meetings: Not on file  . Marital Status: Not on file  Intimate Partner Violence:   . Fear of Current or Ex-Partner: Not on file  . Emotionally Abused: Not on file  . Physically Abused: Not on file  . Sexually Abused: Not on file    Subjective: Review of Systems  Constitutional: Negative for chills and fever.  HENT: Negative for congestion and hearing loss.   Eyes: Negative for blurred vision and double vision.  Respiratory: Negative for cough and shortness of breath.   Cardiovascular: Negative for chest pain and palpitations.  Gastrointestinal: Positive for diarrhea. Negative for abdominal pain, blood in stool, constipation, heartburn, melena and vomiting.  Genitourinary: Negative for dysuria and urgency.  Musculoskeletal: Negative for joint pain and myalgias.  Skin: Negative for itching and rash.  Neurological: Negative for dizziness and headaches.  Psychiatric/Behavioral: Negative for depression. The  patient is not nervous/anxious.        Objective: BP 107/63   Pulse 75   Temp (!) 96.5 F (35.8 C) (Temporal)   Ht 5\' 5"  (1.651 m)   BMI 25.46 kg/m  Physical Exam Vitals reviewed: In strecther.  Constitutional:      Appearance: Normal appearance.  HENT:     Head: Normocephalic and atraumatic.  Eyes:     Extraocular Movements: Extraocular movements intact.     Conjunctiva/sclera: Conjunctivae normal.  Cardiovascular:     Rate and Rhythm: Normal rate and regular rhythm.  Pulmonary:     Effort: Pulmonary effort is normal.     Breath sounds: Normal breath sounds.  Abdominal:     General: Bowel sounds are normal.     Palpations: Abdomen is soft.  Musculoskeletal:        General: Swelling present. Normal range of motion.     Cervical back: Normal range of motion and neck supple.     Right lower leg: Edema present.     Left lower leg: Edema present.  Skin:    General: Skin is warm and dry.     Coloration: Skin is not jaundiced.  Neurological:     General: No focal deficit present.     Mental Status: She is alert and oriented to person, place, and time.  Psychiatric:        Mood and Affect: Mood normal.        Behavior: Behavior normal.      Assessment: *Recurrent C. difficile infection-improved *Diarrhea *Rectal bleeding  Plan: Based on what I can gather, patient has had 2 episodes of C. difficile.  She has completed two 6-week courses of antibiotics.  Appears she has been on both vancomycin and metronidazole.  I spoke with the nurse at the [facility] who states her diarrhea has improved, currently having an average of 3 bowel movements daily.  Discussed C. difficile  in depth with patient and her daughter who accompanies her today.  If she has another episode, we can consider trying a course of Dificid.   Patient would likely benefit from fecal microbiota transplant though given her age and comorbidities, colonoscopy would be difficult.  I recommend patient go  on prophylactic vancomycin 125 mg daily if she is placed on antibiotics for any other reason.  Etiology of her rectal bleeding is unclear.  Likely hemorrhoidal, continue to monitor.  Will defer colonoscopy at this time as above.     01/18/2020 11:14 AM   Disclaimer: This note was dictated with voice recognition software. Similar sounding words can inadvertently be transcribed and may not be corrected upon review.

## 2020-01-20 ENCOUNTER — Encounter: Payer: Self-pay | Admitting: Urology

## 2020-01-20 ENCOUNTER — Ambulatory Visit (INDEPENDENT_AMBULATORY_CARE_PROVIDER_SITE_OTHER): Payer: Medicare Other | Admitting: Urology

## 2020-01-20 ENCOUNTER — Other Ambulatory Visit: Payer: Self-pay

## 2020-01-20 VITALS — BP 115/86 | HR 82 | Temp 97.3°F

## 2020-01-20 DIAGNOSIS — R339 Retention of urine, unspecified: Secondary | ICD-10-CM | POA: Diagnosis not present

## 2020-01-20 NOTE — Progress Notes (Signed)
Urological Symptom Review  Patient is experiencing the following symptoms: Hard to postpone urination Burning/pain with urination Leakage of urine Trouble starting stream Urinary tract infection   Review of Systems  Gastrointestinal (upper)  : Nausea  Gastrointestinal (lower) : Negative for lower GI symptoms  Constitutional : Night Sweats Fatigue  Skin: Negative for skin symptoms  Eyes: Negative for eye symptoms  Ear/Nose/Throat : Negative for Ear/Nose/Throat symptoms  Hematologic/Lymphatic: Negative for Hematologic/Lymphatic symptoms  Cardiovascular : Negative for cardiovascular symptoms  Respiratory : Negative for respiratory symptoms  Endocrine: Negative for endocrine symptoms  Musculoskeletal: Negative for musculoskeletal symptoms  Neurological: Negative for neurological symptoms  Psychologic: Depression

## 2020-01-20 NOTE — Progress Notes (Signed)
Subjective: 1. Urinary retention     Kristen Sanchez is an 79 yo female who is sent from the [facility] following a recent hospital admission at [facility] for colitis.  She was found to have a UTI with Proteus and urinary retention in July.  She had a residual of .  She has failed several voiding trial.  Her bladder wall was very thick on CT with foley in place.  She has bilpolar disorder and is on multiple meds with anticholinergic properties and is on Myrbetriq as well.  She has tardive dyskinesia and may have Parkinson's disease.   Prior to foley placement she had large volume incontinence and probably had overflow.  ROS:  ROS  Allergies  Allergen Reactions   Lithium     Past Medical History:  Diagnosis Date   Arthritis    Bipolar disorder (HCC)    COPD (chronic obstructive pulmonary disease) (HCC)    Depression    GERD (gastroesophageal reflux disease)    High cholesterol    Hypothyroidism    Thyroid disease    UTI (lower urinary tract infection)     Past Surgical History:  Procedure Laterality Date   CATARACT EXTRACTION W/PHACO Right 11/14/2013   Procedure: CATARACT EXTRACTION PHACO AND INTRAOCULAR LENS PLACEMENT RIGHT EYE CDE=14.26;  Surgeon: Gemma Payor, MD;  Location: [facility];  Service: Ophthalmology;  Laterality: Right;   CATARACT EXTRACTION W/PHACO Left 11/28/2013   Procedure: CATARACT EXTRACTION PHACO AND INTRAOCULAR LENS PLACEMENT (IOC);  Surgeon: Gemma Payor, MD;  Location: [facility];  Service: Ophthalmology;  Laterality: Left;  CDE 8.65   CHOLECYSTECTOMY     EXTERNAL FIXATION WRIST FRACTURE Right    [place]- 10/2013    Social History   Socioeconomic History   Marital status: Widowed    Spouse name: Not on file   Number of children: 2   Years of education: Not on file   Highest education level: Not on file  Occupational History   Not on file  Tobacco Use   Smoking status: Former Smoker    Packs/day: 1.00    Years: 50.00    Pack years: 50.00     Types: Cigarettes   Smokeless tobacco: Never Used  Substance and Sexual Activity   Alcohol use: No   Drug use: No   Sexual activity: Yes    Birth control/protection: Post-menopausal  Other Topics Concern   Not on file  Social History Narrative   Not on file   Social Determinants of Health   Financial Resource Strain:    Difficulty of Paying Living Expenses: Not on file  Food Insecurity:    Worried About Programme researcher, broadcasting/film/video in the Last Year: Not on file   [employer] of Food in the Last Year: Not on file  Transportation Needs:    Lack of Transportation (Medical): Not on file   Lack of Transportation (Non-Medical): Not on file  Physical Activity:    Days of Exercise per Week: Not on file   Minutes of Exercise per Session: Not on file  Stress:    Feeling of Stress : Not on file  Social Connections:    Frequency of Communication with Friends and Family: Not on file   Frequency of Social Gatherings with Friends and Family: Not on file   Attends Religious Services: Not on file   Active Member of Clubs or Organizations: Not on file   Attends Banker Meetings: Not on file   Marital Status: Not on file  Intimate  Partner Violence:    Fear of Current or Ex-Partner: Not on file   Emotionally Abused: Not on file   Physically Abused: Not on file   Sexually Abused: Not on file    History reviewed. No pertinent family history.  Anti-infectives: Anti-infectives (From admission, onward)   None      Current Outpatient Medications  Medication Sig Dispense Refill   albuterol (PROVENTIL HFA;VENTOLIN HFA) 108 (90 BASE) MCG/ACT inhaler Inhale 1-2 puffs into the lungs every 6 (six) hours as needed for wheezing or shortness of breath.     alendronate (FOSAMAX) 70 MG tablet Take 70 mg by mouth once a week. Take with a full glass of water on an empty stomach.     ALPRAZolam (XANAX) 0.5 MG tablet Take 0.5 mg by mouth at bedtime as needed for sleep or  anxiety.      Ascorbic Acid (VITAMIN C) 500 MG CAPS Take by mouth daily.     atorvastatin (LIPITOR) 10 MG tablet Take 10 mg by mouth daily.      beclomethasone (QVAR) 80 MCG/ACT inhaler Inhale 1 puff into the lungs 2 (two) times daily.      calcium-vitamin D (OSCAL WITH D) 500-200 MG-UNIT per tablet Take 1 tablet by mouth daily with breakfast.      carbidopa-levodopa (SINEMET IR) 25-100 MG tablet Take 1 tablet by mouth 3 (three) times daily.     cholecalciferol (VITAMIN D) 1000 UNITS tablet Take 1,000 Units by mouth daily.      divalproex (DEPAKOTE) 250 MG DR tablet Take 250 mg by mouth 3 (three) times daily.     ferrous sulfate 325 (65 FE) MG tablet Take 325 mg by mouth in the morning and at bedtime.     fluticasone (FLOVENT DISKUS) 50 MCG/BLIST diskus inhaler Inhale 1 puff into the lungs 2 (two) times daily.     folic acid (FOLVITE) 400 MCG tablet Take 400 mcg by mouth daily. Takes 2 tablets in the morning.     furosemide (LASIX) 20 MG tablet Take 20 mg by mouth 2 (two) times a week.      gabapentin (NEURONTIN) 300 MG capsule Take 300 mg by mouth 3 (three) times daily.     HYDROcodone-acetaminophen (NORCO/VICODIN) 5-325 MG tablet Take 1 tablet by mouth every 8 (eight) hours as needed for moderate pain.     lamoTRIgine (LAMICTAL) 25 MG tablet Take 50 mg by mouth 2 (two) times daily.      levothyroxine (SYNTHROID, LEVOTHROID) 125 MCG tablet Take 137 mcg by mouth daily before breakfast.      loratadine (CLARITIN) 10 MG tablet Take 10 mg by mouth daily.     melatonin 3 MG TABS tablet Take 3 mg by mouth at bedtime.     metroNIDAZOLE (FLAGYL) 500 MG tablet Take 500 mg by mouth 3 (three) times daily.      mirtazapine (REMERON) 15 MG tablet Take 15 mg by mouth at bedtime.     Multiple Vitamin (MULTIVITAMIN ADULT PO) Take by mouth daily.     Omega-3 Fatty Acids (FISH OIL PO) Take 1 capsule by mouth daily.      PARoxetine (PAXIL) 10 MG tablet Take 5 mg by mouth daily. Taking a 5mg   tablet and a 20mg  tablet every morning per MAR.     primidone (MYSOLINE) 50 MG tablet Take 50 mg by mouth 4 (four) times daily.     vancomycin (VANCOCIN) 125 MG capsule Take 125 mg by mouth 2 (two) times daily.  vancomycin (VANCOCIN) 50 mg/mL oral solution Take 125 mg by mouth every 6 (six) hours. Takes 2.5 ml in the morning every 7 days.      Zinc 50 MG TABS Take by mouth daily.     No current facility-administered medications for this visit.     Objective: Vital signs in last 24 hours: BP 115/86    Pulse 82    Temp (!) 97.3 F (36.3 C)   Intake/Output from previous day: No intake/output data recorded. Intake/Output this shift: @IOTHISSHIFT @   Physical Exam Vitals reviewed.  Constitutional:      Appearance: Normal appearance.  Neurological:     Mental Status: She is alert.     Lab Results:  No results found for this or any previous visit (from the past 24 hour(s)).  BMET No results for input(s): NA, K, CL, CO2, GLUCOSE, BUN, CREATININE, CALCIUM in the last 72 hours. PT/INR No results for input(s): LABPROT, INR in the last 72 hours. ABG No results for input(s): PHART, HCO3 in the last 72 hours.  Invalid input(s): PCO2, PO2  Studies/Results: Hospital records, imaging and labs reviewed.    Assessment/Plan: Arreflexic bladder with urinary retention and history of overflow incontinence and UTI's.   She is fairly immobile and has failed voiding trials and with a PVR of , her bladder is unlikely to recover function anytime soon.    Continue foley drainage with monthly changes and f/u in 1 year with a Renal .   No orders of the defined types were placed in this encounter.    Orders Placed This Encounter  Procedures   US RENAL    Standing Status:   Future    Standing Expiration Date:   01/19/2021    Order Specific Question:   Reason for Exam (SYMPTOM  OR DIAGNOSIS REQUIRED)    Answer:   Urinary retention    Order Specific Question:   Preferred imaging  location?    Answer:   [facility]     Return in about 1 year (around 01/19/2021) for with renal 03/21/2021..    CC:  Dr. [place] at the [facility].    [facility] 01/20/2020 [phone]

## 2020-03-21 DEATH — deceased

## 2021-01-25 ENCOUNTER — Ambulatory Visit: Payer: Medicaid Other | Admitting: Urology
